# Patient Record
Sex: Female | Born: 1965 | Race: White | Hispanic: No | Marital: Married | Smoking: Former smoker
Health system: Southern US, Community
[De-identification: ages and names within clinical notes are randomized; demographics above are authoritative.]

## PROBLEM LIST (undated history)

## (undated) DIAGNOSIS — R519 Headache, unspecified: Secondary | ICD-10-CM

## (undated) DIAGNOSIS — D649 Anemia, unspecified: Secondary | ICD-10-CM

## (undated) DIAGNOSIS — N920 Excessive and frequent menstruation with regular cycle: Secondary | ICD-10-CM

## (undated) DIAGNOSIS — R51 Headache: Secondary | ICD-10-CM

## (undated) DIAGNOSIS — K921 Melena: Secondary | ICD-10-CM

## (undated) DIAGNOSIS — R197 Diarrhea, unspecified: Secondary | ICD-10-CM

## (undated) HISTORY — DX: Melena: K92.1

## (undated) HISTORY — DX: Headache, unspecified: R51.9

## (undated) HISTORY — DX: Headache: R51

## (undated) HISTORY — DX: Anemia, unspecified: D64.9

## (undated) HISTORY — DX: Diarrhea, unspecified: R19.7

## (undated) HISTORY — DX: Excessive and frequent menstruation with regular cycle: N92.0

## (undated) HISTORY — PX: TONSILLECTOMY: SUR1361

---
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
## 1998-11-25 ENCOUNTER — Other Ambulatory Visit: Admission: RE | Admit: 1998-11-25 | Discharge: 1998-11-25 | Payer: Self-pay | Admitting: Gynecology

## 2000-04-12 ENCOUNTER — Other Ambulatory Visit: Admission: RE | Admit: 2000-04-12 | Discharge: 2000-04-12 | Payer: Self-pay | Admitting: Gynecology

## 2000-09-14 ENCOUNTER — Other Ambulatory Visit: Admission: RE | Admit: 2000-09-14 | Discharge: 2000-09-14 | Payer: Self-pay | Admitting: Gynecology

## 2001-11-14 ENCOUNTER — Other Ambulatory Visit: Admission: RE | Admit: 2001-11-14 | Discharge: 2001-11-14 | Payer: Self-pay | Admitting: Gynecology

## 2003-01-24 ENCOUNTER — Other Ambulatory Visit: Admission: RE | Admit: 2003-01-24 | Discharge: 2003-01-24 | Payer: Self-pay | Admitting: Gynecology

## 2004-11-10 ENCOUNTER — Other Ambulatory Visit: Admission: RE | Admit: 2004-11-10 | Discharge: 2004-11-10 | Payer: Self-pay | Admitting: Gynecology

## 2007-05-03 ENCOUNTER — Other Ambulatory Visit: Admission: RE | Admit: 2007-05-03 | Discharge: 2007-05-03 | Payer: Self-pay | Admitting: Gynecology

## 2008-11-20 ENCOUNTER — Encounter: Payer: Self-pay | Admitting: Gynecology

## 2008-11-20 ENCOUNTER — Other Ambulatory Visit: Admission: RE | Admit: 2008-11-20 | Discharge: 2008-11-20 | Payer: Self-pay | Admitting: Gynecology

## 2008-11-20 ENCOUNTER — Ambulatory Visit: Payer: Self-pay | Admitting: Gynecology

## 2008-11-28 ENCOUNTER — Ambulatory Visit: Payer: Self-pay | Admitting: Gynecology

## 2008-11-29 ENCOUNTER — Ambulatory Visit: Payer: Self-pay | Admitting: Gynecology

## 2008-11-29 HISTORY — PX: ENDOMETRIAL ABLATION: SHX621

## 2008-12-23 ENCOUNTER — Ambulatory Visit: Payer: Self-pay | Admitting: Gynecology

## 2009-01-02 ENCOUNTER — Encounter: Admission: RE | Admit: 2009-01-02 | Discharge: 2009-01-02 | Payer: Self-pay | Admitting: Internal Medicine

## 2010-07-23 ENCOUNTER — Encounter: Admission: RE | Admit: 2010-07-23 | Discharge: 2010-07-23 | Payer: Self-pay | Admitting: Gynecology

## 2010-07-31 ENCOUNTER — Encounter: Admission: RE | Admit: 2010-07-31 | Discharge: 2010-07-31 | Payer: Self-pay | Admitting: Gynecology

## 2011-05-19 ENCOUNTER — Encounter: Payer: Self-pay | Admitting: Anesthesiology

## 2011-05-20 ENCOUNTER — Encounter: Payer: Self-pay | Admitting: Gynecology

## 2011-05-20 ENCOUNTER — Other Ambulatory Visit (HOSPITAL_COMMUNITY)
Admission: RE | Admit: 2011-05-20 | Discharge: 2011-05-20 | Disposition: A | Discharge status: Home / Self Care | Payer: 59 | Source: Ambulatory Visit | Attending: Gynecology | Admitting: Gynecology

## 2011-05-20 ENCOUNTER — Ambulatory Visit (INDEPENDENT_AMBULATORY_CARE_PROVIDER_SITE_OTHER): Payer: 59 | Admitting: Gynecology

## 2011-05-20 VITALS — BP 118/76 | Ht 64.5 in | Wt 149.0 lb

## 2011-05-20 DIAGNOSIS — Z01419 Encounter for gynecological examination (general) (routine) without abnormal findings: Secondary | ICD-10-CM

## 2011-05-20 DIAGNOSIS — Z13220 Encounter for screening for lipoid disorders: Secondary | ICD-10-CM

## 2011-05-20 DIAGNOSIS — R102 Pelvic and perineal pain: Secondary | ICD-10-CM

## 2011-05-20 DIAGNOSIS — R635 Abnormal weight gain: Secondary | ICD-10-CM

## 2011-05-20 DIAGNOSIS — N949 Unspecified condition associated with female genital organs and menstrual cycle: Secondary | ICD-10-CM

## 2011-05-20 DIAGNOSIS — O24419 Gestational diabetes mellitus in pregnancy, unspecified control: Secondary | ICD-10-CM | POA: Insufficient documentation

## 2011-05-20 DIAGNOSIS — O99810 Abnormal glucose complicating pregnancy: Secondary | ICD-10-CM

## 2011-05-20 NOTE — Progress Notes (Signed)
Barbara Watson 1966/04/25 [id number]   History:    45 y.o.  for annual exam and with complaint that the summer of 2011 she had some right hip right-sided discomfort when sitting in a car for long periods of time. And most recently she's had right lower quadrant discomfort on and off with pressure sensation more to the lower abdomen. She denies any dyspareunia or any postcoital bleeding. Her husband has had a vasectomy and she has had an endometrial ablation in the past as well. Her last mammogram was in November of 2011 and she does her monthly self breast examination and her menstrual cycles are otherwise regular. She has gained some weight since last year and does have history of gestational diabetes.  Past medical history,surgical history, family history and social history were all reviewed and documented in the EPIC chart.  ROS:  Was performed and pertinent positives and negatives are included in the history.  Exam: chaperone present BP 118/76  Ht 5' 4.5" (1.638 m)  Wt 149 lb (67.586 kg)  BMI 25.18 kg/m2  LMP 04/29/2011  Body mass index is 25.18 kg/(m^2).  General appearance : Well developed well nourished female. No acute distress HEENT: Neck supple, trachea midline, no carotid bruits, no thyroidmegaly Lungs: Clear to auscultation, no rhonchi or wheezes, or rib retractions  Heart: Regular rate and rhythm, no murmurs or gallops Breast:Examined in sitting and supine position were symmetrical in appearance, no palpable masses or tenderness,  no skin retraction, no nipple inversion, no nipple discharge, no skin discoloration, no axillary or supraclavicular lymphadenopathy Abdomen: no palpable masses or tenderness, no rebound or guarding Extremities: no edema or skin discoloration or tenderness  Pelvic:  Bartholin, Urethra, Skene Glands: Within normal limits             Vagina: No gross lesions or discharge  Cervix: No gross lesions or discharge  Uterus  irregular shaped especially to  the right fundal region cannot rule out fibroid versus right ovarian cyst, slightly tender.  Adnexa right adnexal fullness versus ovarian cyst versus a right fundal subserosal fibroid. Left adnexa unremarkable. Anus and perineum  normal   Rectovaginal  normal sphincter tone without palpated masses or tenderness             Hemoccult not done     Assessment/Plan:  45 y.o. female for annual exam with complaint of on and off right lower quadrant discomfort which has increased more frequently than compare from last year. Irregularities and pelvic exam cannot rule out subserosal fibroid versus a right ovarian cyst. Patient will have an ultrasound next week to better delineate. Because of gestational diabetes we'll check a random blood sugar will check her take TSH because or weight gain along with will check her CBC urinalysis cholesterol and Pap smear.   Ok Edwards MD, 12:14 PM 05/20/2011

## 2011-05-31 ENCOUNTER — Ambulatory Visit: Admission: RE | Admit: 2011-05-31 | Payer: 59 | Source: Ambulatory Visit

## 2011-05-31 ENCOUNTER — Ambulatory Visit (INDEPENDENT_AMBULATORY_CARE_PROVIDER_SITE_OTHER): Payer: 59 | Admitting: Gynecology

## 2011-05-31 DIAGNOSIS — K4190 Unilateral femoral hernia, without obstruction or gangrene, not specified as recurrent: Secondary | ICD-10-CM

## 2011-05-31 DIAGNOSIS — N949 Unspecified condition associated with female genital organs and menstrual cycle: Secondary | ICD-10-CM

## 2011-05-31 DIAGNOSIS — R102 Pelvic and perineal pain: Secondary | ICD-10-CM

## 2011-05-31 NOTE — Patient Instructions (Signed)
Barbara Watson, our office will call you with appointment with Dr. Florene Route surgeon to evaluate what I believe is a femoral hernia.  Patient information: Inguinal and femoral (groin) hernias (The Basics)   What is a hernia? - A hernia is an area in a muscle layer that is weak or torn. Often when there is a hernia, tissues that are normally held in by the muscle layer bulge or stick out through the weak or torn spot.  Hernias can happen in different parts of the body. When they happen where the thigh and body meet (called the groin), they are called inguinal or femoral hernias. Inguinal hernias are a bit higher on the groin than femoral hernias) . Both of these types of hernias can form a sac that holds a loop of intestine or a piece of fat pad that normally sits inside the belly. (The fat pad is called the omentum.) What are the symptoms of a groin hernia? - Groin hernias do not always cause symptoms. But when symptoms do occur, they can include: A heavy or tugging feeling in the groin area  Dull pain that is worst when straining, lifting, coughing, or otherwise using the muscles near the groin  A bulge or lump at the groin Hernias can be very painful and even dangerous if the tissue in the hernia becomes trapped and unable to slide back into the belly). When this happens, the tissue does not get enough blood, so it can become swollen or get damaged.  Should I see a doctor or nurse? - Yes. See a doctor or nurse if you:  Feel or see a bulge in your groin, or  Feel a pulling sensation or pain in your groin even if you have no bulge In most cases, doctors can diagnose a hernia just by doing an exam. During the exam, the doctor will ask you to cough while pressing on the bulge. This can be uncomfortable, but it is necessary to find the source of the problem.  Most of the time, the contents of the hernia can be "reduced," or gently pushed back into the belly. Still, there are times when the hernia  gets trapped and can't be pushed back in. If that happens, the tissue that is trapped can get damaged.  If you develop severe pain, or if the bulge can't be pushed back in, call your doctor or surgeon right away. This is an emergency situation.  How are hernias treated? - Not all hernias need treatment right away. But many do need to be repaired with surgery. Surgeons can repair groin hernias in 1 of 2 ways. The right surgery for you will depend on the size of your hernia, whether this is the first time it is getting repaired, and what your general health is like. The 2 types of surgery are: Open surgery - During an open surgery, the surgeon makes an incision near the hernia. Then he or she gently pushes the bulging tissue back into place. Next, the surgeon sews the weak muscle layer back together, so that nothing can bulge through. In some cases, surgeons will also patch the area with a piece of mesh. The mesh can make that part of the muscle wall stronger. That way the hernia is less likely to happen again.  Laparoscopic surgery - During laparoscopic surgery, the surgeon makes a few incisions that are much smaller than those used in open surgery. Then he or she inserts long thin tools into the area near the hernia.  One of the tools has a camera (laparoscope) on the end, which sends pictures to a TV screen. The surgeon can look at the picture on the screen to guide his or her movements. Then he or she uses the long tools to repair the weak muscle layer either with stitches alone or with mesh.  If your hernia has reduced the blood supply to a loop of intestine, your doctor might need to remove that piece of intestine and sew the two ends back together.  If you cannot have surgery because of other health problems, your doctor or surgeon might suggest that you wear a device called a "truss." This device works kind of like a belt or girdle and goes over the hernia to hold it in place. Used the wrong way, a truss  can actually do harm. Do not wear one unless your doctor or surgeon tells you to.

## 2011-05-31 NOTE — Progress Notes (Signed)
Barbara Watson, presented to the office today for followup ultrasound due to the fact at time of her annual exam last week in the office she had stated that since the summer of 2011 she has had right lower abdominal discomfort that radiated from her side she first experienced her she was sitting in a car for long periods of time. She states that her right lower quadrant discomfort is on and off but more pressure sensation especially when she crosses her legs. She denied any dyspareunia or any postcoital bleeding her husband has had a previous vasectomy.  Ultrasound today: Uterus measured 11 x 6.8 x 6.0 cm with an endometrial stripe of 7.8 mm (patient with prior endometrial ablation) patient was one small intramural myoma measured 11 x 9 mm and a second one 60 x 50 mm. The right ovary had evidence of a corpus luteum cyst since she is midcycle. Left ovary small cyst physiological measuring 20 x 14 mm.  Patient was examined in the supine and erect position. She had no CVA tenderness. Abdomen was soft nontender no rebound or guarding. During the examination she was tender in her inguinal region. On Valsalva the tenderness was more evident. When she was examined in the erect position a small defect in the femoral canal was evident with a slight bulge when she coughed or bear down realistic in the point of discomfort that patient has been experiencing.  Assessment: Suspect right femoral hernia reducible. We'll referred to my general surgery colleagues for further assessment and management.

## 2011-06-18 ENCOUNTER — Encounter: Payer: Self-pay | Admitting: *Deleted

## 2011-06-18 NOTE — Progress Notes (Signed)
  Recent office note faxed to [facility] [place] [facility] per request for pt appointment for hernia.

## 2011-06-21 ENCOUNTER — Other Ambulatory Visit (INDEPENDENT_AMBULATORY_CARE_PROVIDER_SITE_OTHER): Payer: Self-pay | Admitting: General Surgery

## 2011-06-21 ENCOUNTER — Ambulatory Visit (INDEPENDENT_AMBULATORY_CARE_PROVIDER_SITE_OTHER): Payer: 59 | Admitting: General Surgery

## 2011-06-21 ENCOUNTER — Encounter (INDEPENDENT_AMBULATORY_CARE_PROVIDER_SITE_OTHER): Payer: Self-pay | Admitting: General Surgery

## 2011-06-21 VITALS — BP 112/86 | HR 60 | Temp 97.6°F | Resp 16 | Ht 65.0 in | Wt 149.1 lb

## 2011-06-21 DIAGNOSIS — R1031 Right lower quadrant pain: Secondary | ICD-10-CM

## 2011-06-21 DIAGNOSIS — K4190 Unilateral femoral hernia, without obstruction or gangrene, not specified as recurrent: Secondary | ICD-10-CM

## 2011-06-21 NOTE — Patient Instructions (Signed)
Try to avoid repetitive lifting.

## 2011-06-22 NOTE — Progress Notes (Signed)
Chief Complaint  Patient presents with  . Other    new pt eval of unilateral femoral hernias    HPI Barbara Watson is a 45 y.o. female.   HPI She is referred by Dr. Lily Peer for evaluation of a possible right femoral hernia. Over the summer she been having pain in the right groin area. This was most noticeable when she was sitting in a car for long periods of time. It was made worse when she crossed her legs. The pain has become more persistent now. She states some times it radiates down her leg. Sometimes it feels like it's in her right hip. No obstructive symptoms. When she was examined by Dr. Lily Peer she is noted to have right groin tenderness and a possible bulge in the right femoral canal area in the standing position when she coughed. She is thus sent here for further evaluation of a possible right femoral hernia. She does have some constipation. No difficulty with urination. She is here with her husband.  Past Medical History  Diagnosis Date  . NSVD (normal spontaneous vaginal delivery)     X 2  . Menorrhagia   . Abdominal pain   . Blood in stool   . Diarrhea   . Constipation   . Generalized headaches   . Diabetes mellitus     gestational  . Anemia     Past Surgical History  Procedure Date  . Endometrial ablation 11/29/2008    HER OPTION  . Tonsillectomy and adenoidectomy 1981    Family History  Problem Relation Age of Onset  . Breast cancer Paternal Grandmother 26  . Heart disease Father     Social History History  Substance Use Topics  . Smoking status: Former Games developer  . Smokeless tobacco: Never Used  . Alcohol Use: Yes     OCCASIONALLY    No Known Allergies  Current Outpatient Prescriptions  Medication Sig Dispense Refill  . CALCIUM PO Take by mouth daily.        . Multiple Vitamin (MULTIVITAMIN PO) Take by mouth daily.          Review of Systems Review of Systems  Constitutional: Negative.   HENT: Negative.   Respiratory: Negative.     Cardiovascular: Negative.   Gastrointestinal: Negative.   Genitourinary: Negative for dysuria, difficulty urinating and dyspareunia.       Right groin pain.  Musculoskeletal:       Some right hip pain.  Hematological: Negative.     Blood pressure 112/86, pulse 60, temperature 97.6 F (36.4 C), resp. rate 16, height 5\' 5"  (1.651 m), weight 149 lb 2 oz (67.643 kg).  Physical Exam Physical Exam  Constitutional: She appears well-developed and well-nourished. No distress.  Abdominal: Soft. She exhibits no distension and no mass. There is no tenderness.  Genitourinary:       Right inguinal tenderness is present to deep palpation. When she's coughs there is a slight bulge felt in the right femoral canal area it quickly reduces. No bulges in the left inguinal area.  Musculoskeletal: Normal range of motion. She exhibits no edema.    Data Reviewed  Note from Dr. Lily Peer office.  Assessment    Right groin pain radiating down her right leg at times. Exam is suspicious for possible right femoral hernia. Other etiologies could be right labral tear or an occult back problem with referred pain.    Plan    We'll check a CT scan of the abdomen and pelvis to evaluate  for the right femoral hernia or other possible pathology. If a right femoral hernias present, I recommended a laparoscopic right femoral hernia repair with mesh. If the CT scan does not show an obvious right femoral hernia, I want her to come back for a second examination. If the clinical suspicion is still present, she may need laparoscopic exploration.  I have explained the procedure, risks, and aftercare of femoral hernia repair.  Risks include but are not limited to bleeding, infection, wound problems, anesthesia, recurrence, bladder or intestine injury, urinary retention, testicular dysfunction, chronic pain, mesh problems.  She seems to understand and agrees to proceed.       Ima Hafner J 06/22/2011, 7:06 AM

## 2011-06-25 ENCOUNTER — Ambulatory Visit
Admission: RE | Admit: 2011-06-25 | Discharge: 2011-06-25 | Disposition: A | Discharge status: Home / Self Care | Payer: 59 | Source: Ambulatory Visit | Attending: General Surgery | Admitting: General Surgery

## 2011-06-25 ENCOUNTER — Telehealth (INDEPENDENT_AMBULATORY_CARE_PROVIDER_SITE_OTHER): Payer: Self-pay | Admitting: General Surgery

## 2011-06-25 ENCOUNTER — Other Ambulatory Visit (INDEPENDENT_AMBULATORY_CARE_PROVIDER_SITE_OTHER): Payer: Self-pay | Admitting: General Surgery

## 2011-06-25 ENCOUNTER — Other Ambulatory Visit: Payer: 59

## 2011-06-25 DIAGNOSIS — R1031 Right lower quadrant pain: Secondary | ICD-10-CM

## 2011-06-25 DIAGNOSIS — K4190 Unilateral femoral hernia, without obstruction or gangrene, not specified as recurrent: Secondary | ICD-10-CM

## 2011-06-25 DIAGNOSIS — M25551 Pain in right hip: Secondary | ICD-10-CM

## 2011-06-25 MED ORDER — IOHEXOL 300 MG/ML  SOLN
100.0000 mL | Freq: Once | INTRAMUSCULAR | Status: AC | PRN
  Administered 2011-06-25: 100 mL via INTRAVENOUS

## 2011-06-25 NOTE — Telephone Encounter (Signed)
I discussed the results of the CT scan with her.  This shows no evidence of an inguinal or femoral hernia.  No obvious hip abnormality.  After further thought and discussion with Dr. Doneen Poisson (Orthopedic Surgeon), I am suspicious that this may be a labral or back problem.  Thus, I recommended a referral to Dr. Magnus Ivan instead of coming to see me again at this time.  She understands and agrees with the plan.

## 2011-07-06 ENCOUNTER — Ambulatory Visit (INDEPENDENT_AMBULATORY_CARE_PROVIDER_SITE_OTHER): Payer: 59 | Admitting: General Surgery

## 2011-07-06 ENCOUNTER — Other Ambulatory Visit: Payer: Self-pay | Admitting: Orthopaedic Surgery

## 2011-07-06 DIAGNOSIS — M25551 Pain in right hip: Secondary | ICD-10-CM

## 2011-07-11 ENCOUNTER — Other Ambulatory Visit: Payer: No Typology Code available for payment source

## 2011-07-15 ENCOUNTER — Ambulatory Visit
Admission: RE | Admit: 2011-07-15 | Discharge: 2011-07-15 | Disposition: A | Discharge status: Home / Self Care | Payer: No Typology Code available for payment source | Source: Ambulatory Visit | Attending: Orthopaedic Surgery | Admitting: Orthopaedic Surgery

## 2011-07-15 DIAGNOSIS — M25551 Pain in right hip: Secondary | ICD-10-CM

## 2011-08-12 ENCOUNTER — Other Ambulatory Visit: Payer: Self-pay | Admitting: Gynecology

## 2011-08-12 DIAGNOSIS — Z1231 Encounter for screening mammogram for malignant neoplasm of breast: Secondary | ICD-10-CM

## 2011-08-26 ENCOUNTER — Ambulatory Visit
Admission: RE | Admit: 2011-08-26 | Discharge: 2011-08-26 | Disposition: A | Discharge status: Home / Self Care | Payer: 59 | Source: Ambulatory Visit | Attending: Gynecology | Admitting: Gynecology

## 2011-08-26 DIAGNOSIS — Z1231 Encounter for screening mammogram for malignant neoplasm of breast: Secondary | ICD-10-CM

## 2012-09-20 ENCOUNTER — Other Ambulatory Visit: Payer: Self-pay | Admitting: Gynecology

## 2012-09-20 DIAGNOSIS — Z1231 Encounter for screening mammogram for malignant neoplasm of breast: Secondary | ICD-10-CM

## 2012-10-18 ENCOUNTER — Ambulatory Visit
Admission: RE | Admit: 2012-10-18 | Discharge: 2012-10-18 | Disposition: A | Discharge status: Home / Self Care | Payer: 59 | Source: Ambulatory Visit | Attending: Gynecology | Admitting: Gynecology

## 2012-10-18 DIAGNOSIS — Z1231 Encounter for screening mammogram for malignant neoplasm of breast: Secondary | ICD-10-CM

## 2013-05-10 ENCOUNTER — Encounter: Payer: Self-pay | Admitting: Gynecology

## 2013-05-10 ENCOUNTER — Ambulatory Visit (INDEPENDENT_AMBULATORY_CARE_PROVIDER_SITE_OTHER): Payer: 59 | Admitting: Gynecology

## 2013-05-10 VITALS — BP 126/74 | Ht 64.5 in | Wt 148.0 lb

## 2013-05-10 DIAGNOSIS — R232 Flushing: Secondary | ICD-10-CM

## 2013-05-10 DIAGNOSIS — N912 Amenorrhea, unspecified: Secondary | ICD-10-CM

## 2013-05-10 DIAGNOSIS — N951 Menopausal and female climacteric states: Secondary | ICD-10-CM

## 2013-05-10 DIAGNOSIS — Z01419 Encounter for gynecological examination (general) (routine) without abnormal findings: Secondary | ICD-10-CM

## 2013-05-10 DIAGNOSIS — N390 Urinary tract infection, site not specified: Secondary | ICD-10-CM | POA: Insufficient documentation

## 2013-05-10 LAB — CBC WITH DIFFERENTIAL/PLATELET
Basophils Absolute: 0 10*3/uL (ref 0.0–0.1)
Eosinophils Absolute: 0.1 10*3/uL (ref 0.0–0.7)
Eosinophils Relative: 1 % (ref 0–5)
Lymphocytes Relative: 28 % (ref 12–46)
MCH: 28.8 pg (ref 26.0–34.0)
MCV: 86.7 fL (ref 78.0–100.0)
Neutrophils Relative %: 61 % (ref 43–77)
Platelets: 316 10*3/uL (ref 150–400)
RDW: 12.5 % (ref 11.5–15.5)
WBC: 6.1 10*3/uL (ref 4.0–10.5)

## 2013-05-10 LAB — COMPREHENSIVE METABOLIC PANEL
Albumin: 4.3 g/dL (ref 3.5–5.2)
Alkaline Phosphatase: 53 U/L (ref 39–117)
BUN: 14 mg/dL (ref 6–23)
CO2: 26 mEq/L (ref 19–32)
Calcium: 9.1 mg/dL (ref 8.4–10.5)
Glucose, Bld: 90 mg/dL (ref 70–99)
Sodium: 138 mEq/L (ref 135–145)
Total Protein: 6.5 g/dL (ref 6.0–8.3)

## 2013-05-10 LAB — CHOLESTEROL, TOTAL: Cholesterol: 178 mg/dL (ref 0–200)

## 2013-05-10 MED ORDER — NITROFURANTOIN MONOHYD MACRO 100 MG PO CAPS: 100.0000 mg | ORAL_CAPSULE | Freq: Two times a day (BID) | ORAL | Status: DC

## 2013-05-10 NOTE — Progress Notes (Signed)
Barbara Watson 1965-12-22 [id number]   History:    47 y.o.  for annual gyn exam who states that she has not been seen in the office since 2012. Patient had been evaluated for a right groin pain to rule out inguinal hernia.but after CT scan they do not feel that she had a right inguinal hernia and she's been working on an exercise and the symptoms come and go and she's had for many years. She has not had a menstrual cycle in the past 5 months. She states that on and off she's had some vasomotor symptoms. Her mother which the menopause before the age of 38. She has had an endometrial ablation back in 2010. Her husband has had a vasectomy. Patient also brought to my attention that she has had recurrent urinary tract infections in November, January and June. She was asymptomatic today. Her last mammogram was assured which was normal.   Past medical history,surgical history, family history and social history were all reviewed and documented in the EPIC chart.  Gynecologic History Patient's last menstrual period was 04/19/2013. Contraception: vasectomy Last Pap: 2012. Results were: normal Last mammogram: 2014. Results were: normal  Obstetric History OB History  Gravida Para Term Preterm AB SAB TAB Ectopic Multiple Living  2 2        2     # Outcome Date GA Lbr Len/2nd Weight Sex Delivery Anes PTL Lv  2 PAR           1 PAR                ROS: A ROS was performed and pertinent positives and negatives are included in the history.  GENERAL: No fevers or chills. HEENT: No change in vision, no earache, sore throat or sinus congestion. NECK: No pain or stiffness. CARDIOVASCULAR: No chest pain or pressure. No palpitations. PULMONARY: No shortness of breath, cough or wheeze. GASTROINTESTINAL: No abdominal pain, nausea, vomiting or diarrhea, melena or bright red blood per rectum. GENITOURINARY: No urinary frequency, urgency, hesitancy or dysuria. MUSCULOSKELETAL: No joint or muscle pain, no back pain, no  recent trauma. DERMATOLOGIC: No rash, no itching, no lesions. ENDOCRINE: No polyuria, polydipsia, no heat or cold intolerance. No recent change in weight. HEMATOLOGICAL: No anemia or easy bruising or bleeding. NEUROLOGIC: No headache, seizures, numbness, tingling or weakness. PSYCHIATRIC: No depression, no loss of interest in normal activity or change in sleep pattern.     Exam: chaperone present  BP 126/74  Ht 5' 4.5" (1.638 m)  Wt 148 lb (67.132 kg)  BMI 25.02 kg/m2  LMP 04/19/2013  Body mass index is 25.02 kg/(m^2).  General appearance : Well developed well nourished female. No acute distress HEENT: Neck supple, trachea midline, no carotid bruits, no thyroidmegaly Lungs: Clear to auscultation, no rhonchi or wheezes, or rib retractions  Heart: Regular rate and rhythm, no murmurs or gallops Breast:Examined in sitting and supine position were symmetrical in appearance, no palpable masses or tenderness,  no skin retraction, no nipple inversion, no nipple discharge, no skin discoloration, no axillary or supraclavicular lymphadenopathy Abdomen: no palpable masses or tenderness, no rebound or guarding Extremities: no edema or skin discoloration or tenderness  Pelvic:  Bartholin, Urethra, Skene Glands: Within normal limits             Vagina: No gross lesions or discharge  Cervix: No gross lesions or discharge  Uterus  anteverted, normal size, shape and consistency, non-tender and mobile  Adnexa  Without masses or tenderness  Anus and  perineum  normal   Rectovaginal  normal sphincter tone without palpated masses or tenderness             Hemoccult not indicated     Assessment/Plan:  47 y.o. female for annual exam with five-month history of amenorrhea. She did have an endometrial ablation several years ago. Patient was some minor vasomotor symptoms. We will go ahead and check an [facility] today along with her TSH and prolactin. She denied any nipple discharge, headaches or visual disturbances.  We'll also check her CBC, screening cholesterol, comprehensive metabolic panel, TSH and urinalysis. She did receive the Tdap vaccine today. I'm going to prescribe her Macrobid to take one by mouth after intercourse prophylactically in the event of finding cystitis. Pap smear not done today the new guidelines were discussed. Patient was reminded to do her monthly breast exam. We discussed importance of calcium vitamin D and regular exercise for osteoporosis prevention.    Ok Edwards MD, 12:23 PM 05/10/2013

## 2013-05-10 NOTE — Patient Instructions (Addendum)
Tetanus, Diphtheria, Pertussis (Tdap) Vaccine What You Need to Know WHY GET VACCINATED? Tetanus, diphtheria and pertussis can be very serious diseases, even for adolescents and adults. Tdap vaccine can protect us from these diseases. TETANUS (Lockjaw) causes painful muscle tightening and stiffness, usually all over the body.  It can lead to tightening of muscles in the head and neck so you can't open your mouth, swallow, or sometimes even breathe. Tetanus kills about 1 out of 5 people who are infected. DIPHTHERIA can cause a thick coating to form in the back of the throat.  It can lead to breathing problems, paralysis, heart failure, and death. PERTUSSIS (Whooping Cough) causes severe coughing spells, which can cause difficulty breathing, vomiting and disturbed sleep.  It can also lead to weight loss, incontinence, and rib fractures. Up to 2 in 100 adolescents and 5 in 100 adults with pertussis are hospitalized or have complications, which could include pneumonia and death. These diseases are caused by bacteria. Diphtheria and pertussis are spread from person to person through coughing or sneezing. Tetanus enters the body through cuts, scratches, or wounds. Before vaccines, the [place] saw as many as 200,000 cases a year of diphtheria and pertussis, and hundreds of cases of tetanus. Since vaccination began, tetanus and diphtheria have dropped by about 99% and pertussis by about 80%. TDAP VACCINE Tdap vaccine can protect adolescents and adults from tetanus, diphtheria, and pertussis. One dose of Tdap is routinely given at age 11 or 12. People who did not get Tdap at that age should get it as soon as possible. Tdap is especially important for health care professionals and anyone having close contact with a baby younger than 12 months. Pregnant women should get a dose of Tdap during every pregnancy, to protect the newborn from pertussis. Infants are most at risk for severe, life-threatening  complications from pertussis. A similar vaccine, called Td, protects from tetanus and diphtheria, but not pertussis. A Td booster should be given every 10 years. Tdap may be given as one of these boosters if you have not already gotten a dose. Tdap may also be given after a severe cut or burn to prevent tetanus infection. Your doctor can give you more information. Tdap may safely be given at the same time as other vaccines. SOME PEOPLE SHOULD NOT GET THIS VACCINE  If you ever had a life-threatening allergic reaction after a dose of any tetanus, diphtheria, or pertussis containing vaccine, OR if you have a severe allergy to any part of this vaccine, you should not get Tdap. Tell your doctor if you have any severe allergies.  If you had a coma, or long or multiple seizures within 7 days after a childhood dose of DTP or DTaP, you should not get Tdap, unless a cause other than the vaccine was found. You can still get Td.  Talk to your doctor if you:  have epilepsy or another nervous system problem,  had severe pain or swelling after any vaccine containing diphtheria, tetanus or pertussis,  ever had Guillain-Barr Syndrome (GBS),  aren't feeling well on the day the shot is scheduled. RISKS OF A VACCINE REACTION With any medicine, including vaccines, there is a chance of side effects. These are usually mild and go away on their own, but serious reactions are also possible. Brief fainting spells can follow a vaccination, leading to injuries from falling. Sitting or lying down for about 15 minutes can help prevent these. Tell your doctor if you feel dizzy or light-headed, or   have vision changes or ringing in the ears. Mild problems following Tdap (Did not interfere with activities)  Pain where the shot was given (about 3 in 4 adolescents or 2 in 3 adults)  Redness or swelling where the shot was given (about 1 person in 5)  Mild fever of at least 100.4F (up to about 1 in 25 adolescents or 1 in  100 adults)  Headache (about 3 or 4 people in 10)  Tiredness (about 1 person in 3 or 4)  Nausea, vomiting, diarrhea, stomach ache (up to 1 in 4 adolescents or 1 in 10 adults)  Chills, body aches, sore joints, rash, swollen glands (uncommon) Moderate problems following Tdap (Interfered with activities, but did not require medical attention)  Pain where the shot was given (about 1 in 5 adolescents or 1 in 100 adults)  Redness or swelling where the shot was given (up to about 1 in 16 adolescents or 1 in 25 adults)  Fever over 102F (about 1 in 100 adolescents or 1 in 250 adults)  Headache (about 3 in 20 adolescents or 1 in 10 adults)  Nausea, vomiting, diarrhea, stomach ache (up to 1 or 3 people in 100)  Swelling of the entire arm where the shot was given (up to about 3 in 100). Severe problems following Tdap (Unable to perform usual activities, required medical attention)  Swelling, severe pain, bleeding and redness in the arm where the shot was given (rare). A severe allergic reaction could occur after any vaccine (estimated less than 1 in a million doses). WHAT IF THERE IS A SERIOUS REACTION? What should I look for?  Look for anything that concerns you, such as signs of a severe allergic reaction, very high fever, or behavior changes. Signs of a severe allergic reaction can include hives, swelling of the face and throat, difficulty breathing, a fast heartbeat, dizziness, and weakness. These would start a few minutes to a few hours after the vaccination. What should I do?  If you think it is a severe allergic reaction or other emergency that can't wait, call 9-1-1 or get the person to the nearest hospital. Otherwise, call your doctor.  Afterward, the reaction should be reported to the "Vaccine Adverse Event Reporting System" (VAERS). Your doctor might file this report, or you can do it yourself through the VAERS web site at www.vaers.hhs.gov, or by calling 1-800-822-7967. VAERS is  only for reporting reactions. They do not give medical advice.  THE NATIONAL VACCINE INJURY COMPENSATION PROGRAM The National Vaccine Injury Compensation Program (VICP) is a federal program that was created to compensate people who may have been injured by certain vaccines. Persons who believe they may have been injured by a vaccine can learn about the program and about filing a claim by calling 1-800-338-2382 or visiting the VICP website at www.hrsa.gov/vaccinecompensation. HOW CAN I LEARN MORE?  Ask your doctor.  Call your local or state health department.  Contact the Centers for Disease Control and Prevention (CDC):  Call 1-800-232-4636 or visit CDC's website at www.cdc.gov/vaccines. CDC Tdap Vaccine VIS (01/20/12) Document Released: 02/29/2012 Document Revised: 05/24/2012 Document Reviewed: 02/29/2012 Exit[employer] Patient Information 2014 [employer]. Perimenopause Perimenopause is the time when your body begins to move into the menopause (no menstrual period for 12 straight months). It is a natural process. Perimenopause can begin 2 to 8 years before the menopause and usually lasts for one year after the menopause. During this time, your ovaries may or may not produce an egg. The ovaries vary in   their production of estrogen and progesterone hormones each month. This can cause irregular menstrual periods, difficulty in getting pregnant, vaginal bleeding between periods and uncomfortable symptoms. CAUSES  Irregular production of the ovarian hormones, estrogen and progesterone, and not ovulating every month.  Other causes include:  Tumor of the pituitary gland in the brain.  Medical disease that affects the ovaries.  Radiation treatment.  Chemotherapy.  Unknown causes.  Heavy smoking and excessive alcohol intake can bring on perimenopause sooner. SYMPTOMS   Hot flashes.  Night sweats.  Irregular menstrual periods.  Decrease sex drive.  Vaginal  dryness.  Headaches.  Mood swings.  Depression.  Memory problems.  Irritability.  Tiredness.  Weight gain.  Trouble getting pregnant.  The beginning of losing bone cells (osteoporosis).  The beginning of hardening of the arteries (atherosclerosis). DIAGNOSIS  Your caregiver will make a diagnosis by analyzing your age, menstrual history and your symptoms. They will do a physical exam noting any changes in your body, especially your female organs. Female hormone tests may or may not be helpful depending on the amount and when you produce the female hormones. However, other hormone tests may be helpful (ex. thyroid hormone) to rule out other problems. TREATMENT  The decision to treat during the perimenopause should be made by you and your caregiver depending on how the symptoms are affecting you and your life style. There are various treatments available such as:  Treating individual symptoms with a specific medication for that symptom (ex. tranquilizer for depression).  Herbal medications that can help specific symptoms.  Counseling.  Group therapy.  No treatment. HOME CARE INSTRUCTIONS   Before seeing your caregiver, make a list of your menstrual periods (when the occur, how heavy they are, how long between periods and how long they last), your symptoms and when they started.  Take the medication as recommended by your caregiver.  Sleep and rest.  Exercise.  Eat a diet that contains calcium (good for your bones) and soy (acts like estrogen hormone).  Do not smoke.  Avoid alcoholic beverages.  Taking vitamin E may help in certain cases.  Take calcium and vitamin D supplements to help prevent bone loss.  Group therapy is sometimes helpful.  Acupuncture may help in some cases. SEEK MEDICAL CARE IF:   You have any of the above and want to know if it is perimenopause.  You want advice and treatment for any of your symptoms mentioned above.  You need a  referral to a specialist (gynecologist, psychiatrist or psychologist). SEEK IMMEDIATE MEDICAL CARE IF:   You have vaginal bleeding.  Your period lasts longer than 8 days.  You periods are recurring sooner than 21 days.  You have bleeding after intercourse.  You have severe depression.  You have pain when you urinate.  You have severe headaches.  You develop vision problems. Document Released: 10/07/2004 Document Revised: 11/22/2011 Document Reviewed: 06/27/2008 [employer] Patient Information 2014 [employer].  

## 2013-05-11 LAB — URINALYSIS W MICROSCOPIC + REFLEX CULTURE
Bacteria, UA: NONE SEEN
Hgb urine dipstick: NEGATIVE
Ketones, ur: NEGATIVE mg/dL
Leukocytes, UA: NEGATIVE
Nitrite: NEGATIVE
Protein, ur: NEGATIVE mg/dL
Urobilinogen, UA: 0.2 mg/dL (ref 0.0–1.0)

## 2013-05-11 LAB — FOLLICLE STIMULATING HORMONE: FSH: 9.2 m[IU]/mL

## 2013-05-11 LAB — PROLACTIN: Prolactin: 9.2 ng/mL

## 2013-05-11 LAB — TSH: TSH: 1.361 u[IU]/mL (ref 0.350–4.500)

## 2013-05-16 ENCOUNTER — Encounter: Payer: Self-pay | Admitting: Gynecology

## 2013-05-23 ENCOUNTER — Encounter: Payer: Self-pay | Admitting: Gynecology

## 2013-09-04 ENCOUNTER — Encounter (HOSPITAL_BASED_OUTPATIENT_CLINIC_OR_DEPARTMENT_OTHER): Payer: Self-pay | Admitting: Emergency Medicine

## 2013-09-04 ENCOUNTER — Emergency Department (HOSPITAL_BASED_OUTPATIENT_CLINIC_OR_DEPARTMENT_OTHER)
Admission: EM | Admit: 2013-09-04 | Discharge: 2013-09-04 | Disposition: A | Discharge status: Home / Self Care | Payer: 59 | Attending: Emergency Medicine | Admitting: Emergency Medicine

## 2013-09-04 DIAGNOSIS — Z8632 Personal history of gestational diabetes: Secondary | ICD-10-CM | POA: Insufficient documentation

## 2013-09-04 DIAGNOSIS — W260XXA Contact with knife, initial encounter: Secondary | ICD-10-CM | POA: Insufficient documentation

## 2013-09-04 DIAGNOSIS — Y929 Unspecified place or not applicable: Secondary | ICD-10-CM | POA: Insufficient documentation

## 2013-09-04 DIAGNOSIS — Z8719 Personal history of other diseases of the digestive system: Secondary | ICD-10-CM | POA: Insufficient documentation

## 2013-09-04 DIAGNOSIS — Y939 Activity, unspecified: Secondary | ICD-10-CM | POA: Insufficient documentation

## 2013-09-04 DIAGNOSIS — S61219A Laceration without foreign body of unspecified finger without damage to nail, initial encounter: Secondary | ICD-10-CM

## 2013-09-04 DIAGNOSIS — Z8742 Personal history of other diseases of the female genital tract: Secondary | ICD-10-CM | POA: Insufficient documentation

## 2013-09-04 DIAGNOSIS — Z79899 Other long term (current) drug therapy: Secondary | ICD-10-CM | POA: Insufficient documentation

## 2013-09-04 DIAGNOSIS — Z792 Long term (current) use of antibiotics: Secondary | ICD-10-CM | POA: Insufficient documentation

## 2013-09-04 DIAGNOSIS — S61209A Unspecified open wound of unspecified finger without damage to nail, initial encounter: Secondary | ICD-10-CM | POA: Insufficient documentation

## 2013-09-04 DIAGNOSIS — Z862 Personal history of diseases of the blood and blood-forming organs and certain disorders involving the immune mechanism: Secondary | ICD-10-CM | POA: Insufficient documentation

## 2013-09-04 DIAGNOSIS — Z87891 Personal history of nicotine dependence: Secondary | ICD-10-CM | POA: Insufficient documentation

## 2013-09-04 NOTE — ED Provider Notes (Signed)
CSN: [id number]     Arrival date & time 09/04/13  1241 History   First MD Initiated Contact with Patient 09/04/13 1321     Chief Complaint  Patient presents with  . Laceration   (Consider location/radiation/quality/duration/timing/severity/associated sxs/prior Treatment) Patient is a 47 y.o. female presenting with skin laceration. The history is provided by the patient. No language interpreter was used.  Laceration Length (cm):  1.5 Depth:  Through dermis Quality: straight   Time since incident:  1 hour Laceration mechanism:  Knife Pain details:    Quality:  Aching   Severity:  No pain   Timing:  Constant   Progression:  Worsening Foreign body present:  No foreign bodies Worsened by:  Nothing tried Ineffective treatments:  None tried Tetanus status:  Up to date   Past Medical History  Diagnosis Date  . NSVD (normal spontaneous vaginal delivery)     X 2  . Menorrhagia   . Abdominal pain   . Blood in stool   . Diarrhea   . Constipation   . Generalized headaches   . Diabetes mellitus     gestational  . Anemia    Past Surgical History  Procedure Laterality Date  . Endometrial ablation  11/29/2008    [facility]  . Tonsillectomy and adenoidectomy  1981   Family History  Problem Relation Age of Onset  . Breast cancer Paternal Grandmother 65  . Heart disease Father    History  Substance Use Topics  . Smoking status: Former Games developer  . Smokeless tobacco: Never Used  . Alcohol Use: Yes     Comment: OCCASIONALLY   OB History   Grav Para Term Preterm Abortions TAB SAB Ect Mult Living   2 2        2      Review of Systems  Skin: Positive for wound.  All other systems reviewed and are negative.    Allergies  Review of patient's allergies indicates no known allergies.  Home Medications   Current Outpatient Rx  Name  Route  Sig  Dispense  Refill  . CALCIUM PO   Oral   Take by mouth daily.           . Cranberry Extract 250 MG TABS   Oral   Take by  mouth.         . Multiple Vitamin (MULTIVITAMIN PO)   Oral   Take by mouth daily.           . nitrofurantoin, macrocrystal-monohydrate, (MACROBID) 100 MG capsule   Oral   Take 1 capsule (100 mg total) by mouth 2 (two) times daily.   14 capsule   5    BP 128/83  Temp(Src) 98.3 F (36.8 C) (Oral)  Resp 16  Ht 5\' 4"  (1.626 m)  Wt 145 lb (65.772 kg)  BMI 24.88 kg/m2  SpO2 100%  LMP 08/21/2013 Physical Exam  Constitutional: She is oriented to person, place, and time. She appears well-developed. She appears distressed.  Musculoskeletal: She exhibits tenderness.  1.5 cm laceration left index finger from  Ns and nv intact  Neurological: She is alert and oriented to person, place, and time. She has normal reflexes.  Psychiatric: She has a normal mood and affect.    ED Course  LACERATION REPAIR Date/Time: 09/04/2013 2:11 PM Performed by: Elson Areas Authorized by: Elson Areas Consent: Verbal consent obtained. Risks and benefits: risks, benefits and alternatives were discussed Consent given by: patient Patient understanding: patient states understanding  of the procedure being performed Required items: required blood products, implants, devices, and special equipment available Body area: upper extremity Location details: left index finger Laceration length: 1.5 cm Tendon involvement: none Nerve involvement: none Anesthesia: local infiltration Preparation: Patient was prepped and draped in the usual sterile fashion. Irrigation solution: saline Debridement: none Degree of undermining: none Skin closure: 5-0 Prolene Number of sutures: 3 Technique: simple Approximation: close Approximation difficulty: simple Patient tolerance: Patient tolerated the procedure well with no immediate complications.   (including critical care time) Labs Review Labs Reviewed - No data to display Imaging Review No results found.  EKG Interpretation   None       MDM   1.  Laceration of finger, initial encounter    Pt advised suture removal in 8 days    Elson Areas, PA-C 09/04/13 1412

## 2013-09-04 NOTE — ED Notes (Signed)
Pt has laceration to index finger on left hand.  CMS intact. No bleeding at present. Pt sts she accidentally cut it on butcher knife.

## 2013-09-04 NOTE — ED Provider Notes (Signed)
Medical screening examination/treatment/procedure(s) were performed by non-physician practitioner and as supervising physician I was immediately available for consultation/collaboration.  EKG Interpretation   None         Holston Oyama, MD 09/04/13 1419 

## 2013-09-04 NOTE — ED Notes (Signed)
Suture cart at bedside 

## 2014-05-27 ENCOUNTER — Other Ambulatory Visit: Payer: Self-pay

## 2014-05-27 DIAGNOSIS — Z1231 Encounter for screening mammogram for malignant neoplasm of breast: Secondary | ICD-10-CM

## 2014-06-17 ENCOUNTER — Ambulatory Visit
Admission: RE | Admit: 2014-06-17 | Discharge: 2014-06-17 | Disposition: A | Discharge status: Home / Self Care | Payer: 59 | Source: Ambulatory Visit

## 2014-06-17 DIAGNOSIS — Z1231 Encounter for screening mammogram for malignant neoplasm of breast: Secondary | ICD-10-CM

## 2014-07-08 ENCOUNTER — Encounter: Payer: 59 | Admitting: Gynecology

## 2014-07-15 ENCOUNTER — Encounter (HOSPITAL_BASED_OUTPATIENT_CLINIC_OR_DEPARTMENT_OTHER): Payer: Self-pay | Admitting: Emergency Medicine

## 2014-07-25 ENCOUNTER — Encounter: Payer: 59 | Admitting: Gynecology

## 2015-05-15 ENCOUNTER — Encounter: Payer: Self-pay | Admitting: Gynecology

## 2015-05-15 ENCOUNTER — Ambulatory Visit (INDEPENDENT_AMBULATORY_CARE_PROVIDER_SITE_OTHER): Payer: BLUE CROSS/BLUE SHIELD | Admitting: Gynecology

## 2015-05-15 ENCOUNTER — Other Ambulatory Visit (HOSPITAL_COMMUNITY)
Admission: RE | Admit: 2015-05-15 | Discharge: 2015-05-15 | Disposition: A | Discharge status: Home / Self Care | Payer: BLUE CROSS/BLUE SHIELD | Source: Ambulatory Visit | Attending: Gynecology | Admitting: Gynecology

## 2015-05-15 VITALS — Ht 64.5 in | Wt 148.0 lb

## 2015-05-15 DIAGNOSIS — N951 Menopausal and female climacteric states: Secondary | ICD-10-CM

## 2015-05-15 DIAGNOSIS — K648 Other hemorrhoids: Secondary | ICD-10-CM | POA: Diagnosis not present

## 2015-05-15 DIAGNOSIS — Z1151 Encounter for screening for human papillomavirus (HPV): Secondary | ICD-10-CM | POA: Insufficient documentation

## 2015-05-15 DIAGNOSIS — Z01419 Encounter for gynecological examination (general) (routine) without abnormal findings: Secondary | ICD-10-CM

## 2015-05-15 DIAGNOSIS — K5901 Slow transit constipation: Secondary | ICD-10-CM | POA: Diagnosis not present

## 2015-05-15 MED ORDER — HYDROCORTISONE ACETATE 25 MG RE SUPP: 25.0000 mg | Freq: Two times a day (BID) | RECTAL | Status: AC

## 2015-05-15 MED ORDER — LINACLOTIDE 145 MCG PO CAPS: 145.0000 ug | ORAL_CAPSULE | Freq: Every day | ORAL | Status: DC

## 2015-05-15 NOTE — Patient Instructions (Signed)
Linaclotide oral capsules What is this medicine? LINACLOTIDE (lin a KLOE tide) is used to treat irritable bowel syndrome (IBS) with constipation as the main problem. It may also be used for relief of chronic constipation. This medicine may be used for other purposes; ask your health care provider or pharmacist if you have questions. COMMON BRAND NAME(S): Linzess What should I tell my health care provider before I take this medicine? They need to know if you have any of these conditions: -history of stool (fecal) impaction -now have diarrhea or have diarrhea often -other medical condition -stomach or intestinal disease, including bowel obstruction or abdominal adhesions -an unusual or allergic reaction to linaclotide, other medicines, foods, dyes, or preservatives -pregnant or trying to get pregnant -breast-feeding How should I use this medicine? Take this medicine by mouth with a glass of water. Follow the directions on the prescription label. Do not cut, crush or chew this medicine. Take on an empty stomach, at least 30 minutes before your first meal of the day. Take your medicine at regular intervals. Do not take your medicine more often than directed. Do not stop taking except on your doctor's advice. A special MedGuide will be given to you by the pharmacist with each prescription and refill. Be sure to read this information carefully each time. Talk to your pediatrician regarding the use of this medicine in children. This medicine is not approved for use in children. Overdosage: If you think you've taken too much of this medicine contact a poison control center or emergency room at once. Overdosage: If you think you have taken too much of this medicine contact a poison control center or emergency room at once. NOTE: This medicine is only for you. Do not share this medicine with others. What if I miss a dose? If you miss a dose, just skip that dose. Wait until your next dose, and take only  that dose. Do not take double or extra doses. What may interact with this medicine? -certain medicines for bowel problems or bladder incontinence (these can cause constipation) This list may not describe all possible interactions. Give your health care provider a list of all the medicines, herbs, non-prescription drugs, or dietary supplements you use. Also tell them if you smoke, drink alcohol, or use illegal drugs. Some items may interact with your medicine. What should I watch for while using this medicine? Visit your doctor for regular check ups. Tell your doctor if your symptoms do not get better or if they get worse. Diarrhea is a common side effect of this medicine. It often begins within 2 weeks of starting this medicine. Stop taking this medicine and call your doctor if you get severe diarrhea. Stop taking this medicine and call your doctor or go to the nearest hospital emergency room right away if you develop unusual or severe stomach-area (abdominal) pain, especially if you also have bright red, bloody stools or black stools that look like tar. What side effects may I notice from receiving this medicine? Side effects that you should report to your doctor or health care professional as soon as possible: -allergic reactions like skin rash, itching or hives, swelling of the face, lips, or tongue -black, tarry stools -bloody or watery diarrhea -new or worsening stomach pain -severe or prolonged diarrhea Side effects that usually do not require medical attention (Report these to your doctor or health care professional if they continue or are bothersome.): -bloating -gas -loose stools This list may not describe all possible side effects.  Call your doctor for medical advice about side effects. You may report side effects to FDA at 1-800-FDA-1088. Where should I keep my medicine? Keep out of the reach of children. Store at room temperature between 20 and 25 degrees C (68 and 77 degrees F).  Keep this medicine in the original container. Keep tightly closed in a dry place. Do not remove the desiccant packet from the bottle, it helps to protect your medicine from moisture. Throw away any unused medicine after the expiration date. NOTE: This sheet is a summary. It may not cover all possible information. If you have questions about this medicine, talk to your doctor, pharmacist, or health care provider.  2015, [employer]vier/[employer] Standard. (2011-05-18 13:05:27) About Constipation  Constipation Overview Constipation is the most common gastrointestinal complaint - about 4 million Americans experience constipation and make 2.5 million physician visits a year to get help for the problem.  Constipation can occur when the colon absorbs too much water, the colon's muscle contraction is slow or sluggish, and/or there is delayed transit time through the colon.  The result is stool that is hard and dry.  Indicators of constipation include straining during bowel movements greater than 25% of the time, having fewer than three bowel movements per week, and/or the feeling of incomplete evacuation.  There are established guidelines ([place] II ) for defining constipation. A person needs to have two or more of the following symptoms for at least 12 weeks (not necessarily consecutive) in the preceding 12 months: . Straining in  greater than 25% of bowel movements . Lumpy or hard stools in greater than 25% of bowel movements . Sensation of incomplete emptying in greater than 25% of bowel movements . Sensation of anorectal obstruction/blockade in greater than 25% of bowel movements . Manual maneuvers to help empty greater than 25% of bowel movements (e.g., digital evacuation, support of the pelvic floor)  . Less than  3 bowel movements/week . Loose stools are not present, and criteria for irritable bowel syndrome are insufficient  Common Causes of Constipation . Lack of fiber in your diet . Lack of physical  activity . Medications, including iron and calcium supplements  . Dairy intake . Dehydration . Abuse of laxatives  Travel  Irritable Bowel Syndrome  Pregnancy  Luteal phase of menstruation (after ovulation and before menses)  Colorectal problems  Intestinal Dysfunction  Treating Constipation  There are several ways of treating constipation, including changes to diet and exercise, use of laxatives, adjustments to the pelvic floor, and scheduled toileting.  These treatments include: . increasing fiber and fluids in the diet  . increasing physical activity . learning muscle coordination   learning proper toileting techniques and toileting modifications   designing and sticking  to a toileting schedule     2007, [employer] Doc.22Hormone Therapy At menopause, your body begins making less estrogen and progesterone hormones. This causes the body to stop having menstrual periods. This is because estrogen and progesterone hormones control your periods and menstrual cycle. A lack of estrogen may cause symptoms such as:  Hot flushes (or hot flashes).  Vaginal dryness.  Dry skin.  Loss of sex drive.  Risk of bone loss (osteoporosis). When this happens, you may choose to take hormone therapy to get back the estrogen lost during menopause. When the hormone estrogen is given alone, it is usually referred to as ET (Estrogen Therapy). When the hormone progestin is combined with estrogen, it is generally called HT (Hormone Therapy). This was formerly known  as hormone replacement therapy (HRT). Your caregiver can help you make a decision on what will be best for you. The decision to use HT seems to change often as new studies are done. Many studies do not agree on the benefits of hormone replacement therapy. LIKELY BENEFITS OF HT INCLUDE PROTECTION FROM:  Hot Flushes (also called hot flashes) - A hot flush is a sudden feeling of heat that spreads over the face and body. The  skin may redden like a blush. It is connected with sweats and sleep disturbance. Women going through menopause may have hot flushes a few times a month or several times per day depending on the woman.  Osteoporosis (bone loss)- Estrogen helps guard against bone loss. After menopause, a woman's bones slowly lose calcium and become weak and brittle. As a result, bones are more likely to break. The hip, wrist, and spine are affected most often. Hormone therapy can help slow bone loss after menopause. Weight bearing exercise and taking calcium with vitamin D also can help prevent bone loss. There are also medications that your caregiver can prescribe that can help prevent osteoporosis.  Vaginal Dryness - Loss of estrogen causes changes in the vagina. Its lining may become thin and dry. These changes can cause pain and bleeding during sexual intercourse. Dryness can also lead to infections. This can cause burning and itching. (Vaginal estrogen treatment can help relieve pain, itching, and dryness.)  Urinary Tract Infections are more common after menopause because of lack of estrogen. Some women also develop urinary incontinence because of low estrogen levels in the vagina and bladder.  Possible other benefits of estrogen include a positive effect on mood and short-term memory in women. RISKS AND COMPLICATIONS  Using estrogen alone without progesterone causes the lining of the uterus to grow. This increases the risk of lining of the uterus (endometrial) cancer. Your caregiver should give another hormone called progestin if you have a uterus.  Women who take combined (estrogen and progestin) HT appear to have an increased risk of breast cancer. The risk appears to be small, but increases throughout the time that HT is taken.  Combined therapy also makes the breast tissue slightly denser which makes it harder to read mammograms (breast X-rays).  Combined, estrogen and progesterone therapy can be taken  together every day, in which case there may be spotting of blood. HT therapy can be taken cyclically in which case you will have menstrual periods. Cyclically means HT is taken for a set amount of days, then not taken, then this process is repeated.  HT may increase the risk of stroke, heart attack, breast cancer and forming blood clots in your leg.  Transdermal estrogen (estrogen that is absorbed through the skin with a patch or a cream) may have more positive results with:  Cholesterol.  Blood pressure.  Blood clots. Having the following conditions may indicate you should not have HT:  Endometrial cancer.  Liver disease.  Breast cancer.  Heart disease.  History of blood clots.  Stroke. TREATMENT   If you choose to take HT and have a uterus, usually estrogen and progestin are prescribed.  Your caregiver will help you decide the best way to take the medications.  Possible ways to take estrogen include:  Pills.  Patches.  Gels.  Sprays.  Vaginal estrogen cream, rings and tablets.  It is best to take the lowest dose possible that will help your symptoms and take them for the shortest period of time that you  can.  Hormone therapy can help relieve some of the problems (symptoms) that affect women at menopause. Before making a decision about HT, talk to your caregiver about what is best for you. Be well informed and comfortable with your decisions. HOME CARE INSTRUCTIONS   Follow your caregivers advice when taking the medications.  A Pap test is done to screen for cervical cancer.  The first Pap test should be done at age 7.  Between ages 80 and 28, Pap tests are repeated every 2 years.  Beginning at age 49, you are advised to have a Pap test every 3 years as long as your past 3 Pap tests have been normal.  Some women have medical problems that increase the chance of getting cervical cancer. Talk to your caregiver about these problems. It is especially important  to talk to your caregiver if a new problem develops soon after your last Pap test. In these cases, your caregiver may recommend more frequent screening and Pap tests.  The above recommendations are the same for women who have or have not gotten the vaccine for HPV (Human Papillomavirus).  If you had a hysterectomy for a problem that was not a cancer or a condition that could lead to cancer, then you no longer need Pap tests. However, even if you no longer need a Pap test, a regular exam is a good idea to make sure no other problems are starting.   If you are between ages 15 and 95, and you have had normal Pap tests going back 10 years, you no longer need Pap tests. However, even if you no longer need a Pap test, a regular exam is a good idea to make sure no other problems are starting.   If you have had past treatment for cervical cancer or a condition that could lead to cancer, you need Pap tests and screening for cancer for at least 20 years after your treatment.  If Pap tests have been discontinued, risk factors (such as a new sexual partner) need to be re-assessed to determine if screening should be resumed.  Some women may need screenings more often if they are at high risk for cervical cancer.  Get mammograms done as per the advice of your caregiver. SEEK IMMEDIATE MEDICAL CARE IF:  You develop abnormal vaginal bleeding.  You have pain or swelling in your legs, shortness of breath, or chest pain.  You develop dizziness or headaches.  You have lumps or changes in your breasts or armpits.  You have slurred speech.  You develop weakness or numbness of your arms or legs.  You have pain, burning, or bleeding when urinating.  You develop abdominal pain. Document Released: 05/29/2003 Document Revised: 11/22/2011 Document Reviewed: 09/16/2010 [facility] Patient Information 2015 [place], [place]. This information is not intended to replace advice given to you by your health care  provider. Make sure you discuss any questions you have with your health care provider. Perimenopause Perimenopause is the time when your body begins to move into the menopause (no menstrual period for 12 straight months). It is a natural process. Perimenopause can begin 2-8 years before the menopause and usually lasts for 1 year after the menopause. During this time, your ovaries may or may not produce an egg. The ovaries vary in their production of estrogen and progesterone hormones each month. This can cause irregular menstrual periods, difficulty getting pregnant, vaginal bleeding between periods, and uncomfortable symptoms. CAUSES  Irregular production of the ovarian hormones, estrogen and progesterone,  and not ovulating every month.  Other causes include:  Tumor of the pituitary gland in the brain.  Medical disease that affects the ovaries.  Radiation treatment.  Chemotherapy.  Unknown causes.  Heavy smoking and excessive alcohol intake can bring on perimenopause sooner. SIGNS AND SYMPTOMS   Hot flashes.  Night sweats.  Irregular menstrual periods.  Decreased sex drive.  Vaginal dryness.  Headaches.  Mood swings.  Depression.  Memory problems.  Irritability.  Tiredness.  Weight gain.  Trouble getting pregnant.  The beginning of losing bone cells (osteoporosis).  The beginning of hardening of the arteries (atherosclerosis). DIAGNOSIS  Your health care provider will make a diagnosis by analyzing your age, menstrual history, and symptoms. He or she will do a physical exam and note any changes in your body, especially your female organs. Female hormone tests may or may not be helpful depending on the amount of female hormones you produce and when you produce them. However, other hormone tests may be helpful to rule out other problems. TREATMENT  In some cases, no treatment is needed. The decision on whether treatment is necessary during the perimenopause should  be made by you and your health care provider based on how the symptoms are affecting you and your lifestyle. Various treatments are available, such as:  Treating individual symptoms with a specific medicine for that symptom.  Herbal medicines that can help specific symptoms.  Counseling.  Group therapy. HOME CARE INSTRUCTIONS   Keep track of your menstrual periods (when they occur, how heavy they are, how long between periods, and how long they last) as well as your symptoms and when they started.  Only take over-the-counter or prescription medicines as directed by your health care provider.  Sleep and rest.  Exercise.  Eat a diet that contains calcium (good for your bones) and soy (acts like the estrogen hormone).  Do not smoke.  Avoid alcoholic beverages.  Take vitamin supplements as recommended by your health care provider. Taking vitamin E may help in certain cases.  Take calcium and vitamin D supplements to help prevent bone loss.  Group therapy is sometimes helpful.  Acupuncture may help in some cases. SEEK MEDICAL CARE IF:   You have questions about any symptoms you are having.  You need a referral to a specialist (gynecologist, psychiatrist, or psychologist). SEEK IMMEDIATE MEDICAL CARE IF:   You have vaginal bleeding.  Your period lasts longer than 8 days.  Your periods are recurring sooner than 21 days.  You have bleeding after intercourse.  You have severe depression.  You have pain when you urinate.  You have severe headaches.  You have vision problems. Document Released: 10/07/2004 Document Revised: 06/20/2013 Document Reviewed: 03/29/2013 [facility] Patient Information 2015 [place], [place]. This information is not intended to replace advice given to you by your health care provider. Make sure you discuss any questions you have with your health care provider.

## 2015-05-15 NOTE — Progress Notes (Signed)
Marcelino Duster Schermerhorn 1966/04/19 [id number]   History:    49 y.o.  for annual gyn exam who has not been seen in the office for the past 2 years. Patient complaining of constipation and hemorrhoids. Also patient complaining of hot flashes, irritability, mood swings and decreased libido. 2 years ago she had a normal FSH. Review of patient's record indicated back in 2012 she had been evaluated for a right groin pain to rule out inguinal hernia.but after CT scan they do not feel that she had a right inguinal hernia and she's been working on an exercise and the symptoms come and go and she's had for many years. Patient stated she had a menstrual cycle very light in December and then one in July. She stated her brother reached the menopause before the age of 76. Patient with no past history of any abnormal Pap smears.  Past medical history,surgical history, family history and social history were all reviewed and documented in the EPIC chart.  Gynecologic History Patient's last menstrual period was 03/27/2015. Contraception: vasectomy Last Pap: 2012. Results were: normal Last mammogram: 2016. Results were: normal  Obstetric History OB History  Gravida Para Term Preterm AB SAB TAB Ectopic Multiple Living  2 2        2     # Outcome Date GA Lbr Len/2nd Weight Sex Delivery Anes PTL Lv  2 Para           1 Para                ROS: A ROS was performed and pertinent positives and negatives are included in the history.  GENERAL: No fevers or chills. HEENT: No change in vision, no earache, sore throat or sinus congestion. NECK: No pain or stiffness. CARDIOVASCULAR: No chest pain or pressure. No palpitations. PULMONARY: No shortness of breath, cough or wheeze. GASTROINTESTINAL: No abdominal pain, nausea, vomiting or diarrhea, melena or bright red blood per rectum. GENITOURINARY: No urinary frequency, urgency, hesitancy or dysuria. MUSCULOSKELETAL: No joint or muscle pain, no back pain, no recent trauma.  DERMATOLOGIC: No rash, no itching, no lesions. ENDOCRINE: No polyuria, polydipsia, no heat or cold intolerance. No recent change in weight. HEMATOLOGICAL: No anemia or easy bruising or bleeding. NEUROLOGIC: No headache, seizures, numbness, tingling or weakness. PSYCHIATRIC: No depression, no loss of interest in normal activity or change in sleep pattern.     Exam: chaperone present  Ht 5' 4.5" (1.638 m)  Wt 148 lb (67.132 kg)  BMI 25.02 kg/m2  LMP 03/27/2015  Body mass index is 25.02 kg/(m^2).  General appearance : Well developed well nourished female. No acute distress HEENT: Eyes: no retinal hemorrhage or exudates,  Neck supple, trachea midline, no carotid bruits, no thyroidmegaly Lungs: Clear to auscultation, no rhonchi or wheezes, or rib retractions  Heart: Regular rate and rhythm, no murmurs or gallops Breast:Examined in sitting and supine position were symmetrical in appearance, no palpable masses or tenderness,  no skin retraction, no nipple inversion, no nipple discharge, no skin discoloration, no axillary or supraclavicular lymphadenopathy Abdomen: no palpable masses or tenderness, no rebound or guarding Extremities: no edema or skin discoloration or tenderness  Pelvic:  Bartholin, Urethra, Skene Glands: Within normal limits             Vagina: No gross lesions or discharge  Cervix: No gross lesions or discharge  Uterus  anteverted, normal size, shape and consistency, non-tender and mobile  Adnexa  Without masses or tenderness  Anus and perineum  normal   Rectovaginal  normal sphincter tone without palpated masses or tenderness             Hemoccult not indicated   Anoscopic exam did not demonstrate any internal/external hemorrhoids patient was reassured  Assessment/Plan:  49 y.o. female for annual exam with history of constipation and intermittent hemorrhoids. Patient will be started on  Linzess 145 g tablet daily. She was encouraged to increase her fluid intake and fiber  diet. Patient will return back next week in a fasting state for fasting blood work to include the following: [facility] metabolic panel, fasting lipid profile, TSH, CBC, and urinalysis. Also because of her vasomotor symptoms/and vein perimenopausal we'll check her [facility] as well. We discussed importance of calcium vitamin D and regular exercise for osteoporosis prevention. Her Pap smear was done today as well. She was reminded of the importance of monthly breast exam. She was provided literature information on the perimenopause as well as on hormone replacement therapy.   Ok Edwards MD, 4:18 PM 05/15/2015

## 2015-05-20 LAB — CYTOLOGY - PAP

## 2015-05-22 ENCOUNTER — Encounter: Payer: 59 | Admitting: Gynecology

## 2015-05-23 ENCOUNTER — Other Ambulatory Visit: Payer: BLUE CROSS/BLUE SHIELD

## 2015-05-23 ENCOUNTER — Other Ambulatory Visit: Payer: Self-pay

## 2015-05-23 DIAGNOSIS — Z1231 Encounter for screening mammogram for malignant neoplasm of breast: Secondary | ICD-10-CM

## 2015-05-23 DIAGNOSIS — N951 Menopausal and female climacteric states: Secondary | ICD-10-CM

## 2015-05-23 DIAGNOSIS — Z01419 Encounter for gynecological examination (general) (routine) without abnormal findings: Secondary | ICD-10-CM

## 2015-05-23 LAB — COMPREHENSIVE METABOLIC PANEL
ALT: 17 U/L (ref 6–29)
AST: 18 U/L (ref 10–35)
Albumin: 4.1 g/dL (ref 3.6–5.1)
Alkaline Phosphatase: 58 U/L (ref 33–115)
BUN: 16 mg/dL (ref 7–25)
CHLORIDE: 104 mmol/L (ref 98–110)
CO2: 29 mmol/L (ref 20–31)
CREATININE: 0.79 mg/dL (ref 0.50–1.10)
Calcium: 9.3 mg/dL (ref 8.6–10.2)
Glucose, Bld: 89 mg/dL (ref 65–99)
Potassium: 4.4 mmol/L (ref 3.5–5.3)
SODIUM: 141 mmol/L (ref 135–146)
Total Bilirubin: 0.6 mg/dL (ref 0.2–1.2)
Total Protein: 6.4 g/dL (ref 6.1–8.1)

## 2015-05-23 LAB — LIPID PANEL
CHOL/HDL RATIO: 3.5 ratio (ref <=5.0)
CHOLESTEROL: 187 mg/dL (ref 125–200)
HDL: 53 mg/dL (ref >=46)
LDL Cholesterol: 117 mg/dL (ref <130)
TRIGLYCERIDES: 83 mg/dL (ref <150)
VLDL: 17 mg/dL (ref <30)

## 2015-05-23 LAB — TSH: TSH: 1.104 u[IU]/mL (ref 0.350–4.500)

## 2015-05-24 LAB — CBC WITH DIFFERENTIAL/PLATELET
BASOS ABS: 0.1 10*3/uL (ref 0.0–0.1)
BASOS PCT: 1 % (ref 0–1)
Eosinophils Absolute: 0.1 10*3/uL (ref 0.0–0.7)
Eosinophils Relative: 2 % (ref 0–5)
HCT: 42.6 % (ref 36.0–46.0)
HEMOGLOBIN: 14.4 g/dL (ref 12.0–15.0)
LYMPHS ABS: 2.1 10*3/uL (ref 0.7–4.0)
Lymphocytes Relative: 39 % (ref 12–46)
MCH: 29.2 pg (ref 26.0–34.0)
MCHC: 33.8 g/dL (ref 30.0–36.0)
MCV: 86.4 fL (ref 78.0–100.0)
MONOS PCT: 9 % (ref 3–12)
MPV: 9.3 fL (ref 8.6–12.4)
Monocytes Absolute: 0.5 10*3/uL (ref 0.1–1.0)
NEUTROS ABS: 2.6 10*3/uL (ref 1.7–7.7)
NEUTROS PCT: 49 % (ref 43–77)
Platelets: 315 10*3/uL (ref 150–400)
RBC: 4.93 MIL/uL (ref 3.87–5.11)
RDW: 12.8 % (ref 11.5–15.5)
WBC: 5.3 10*3/uL (ref 4.0–10.5)

## 2015-05-24 LAB — URINALYSIS W MICROSCOPIC + REFLEX CULTURE
BILIRUBIN URINE: NEGATIVE
Casts: NONE SEEN [LPF]
GLUCOSE, UA: NEGATIVE
Hgb urine dipstick: NEGATIVE
Ketones, ur: NEGATIVE
Nitrite: NEGATIVE
Protein, ur: NEGATIVE
SPECIFIC GRAVITY, URINE: 1.024 (ref 1.001–1.035)
Yeast: NONE SEEN [HPF]
pH: 5 (ref 5.0–8.0)

## 2015-05-24 LAB — FOLLICLE STIMULATING HORMONE: FSH: 89.9 m[IU]/mL

## 2015-05-25 LAB — URINE CULTURE
COLONY COUNT: NO GROWTH
ORGANISM ID, BACTERIA: NO GROWTH

## 2015-06-23 ENCOUNTER — Ambulatory Visit: Payer: BLUE CROSS/BLUE SHIELD | Admitting: Gynecology

## 2015-06-25 ENCOUNTER — Ambulatory Visit: Payer: BLUE CROSS/BLUE SHIELD

## 2015-06-27 ENCOUNTER — Ambulatory Visit: Payer: BLUE CROSS/BLUE SHIELD | Admitting: Gynecology

## 2015-07-24 ENCOUNTER — Ambulatory Visit
Admission: RE | Admit: 2015-07-24 | Discharge: 2015-07-24 | Disposition: A | Discharge status: Home / Self Care | Payer: BLUE CROSS/BLUE SHIELD | Source: Ambulatory Visit

## 2015-07-24 DIAGNOSIS — Z1231 Encounter for screening mammogram for malignant neoplasm of breast: Secondary | ICD-10-CM

## 2015-08-13 ENCOUNTER — Ambulatory Visit (INDEPENDENT_AMBULATORY_CARE_PROVIDER_SITE_OTHER): Payer: BLUE CROSS/BLUE SHIELD | Admitting: Gynecology

## 2015-08-13 ENCOUNTER — Encounter: Payer: Self-pay | Admitting: Gynecology

## 2015-08-13 VITALS — BP 108/68

## 2015-08-13 DIAGNOSIS — Z23 Encounter for immunization: Secondary | ICD-10-CM

## 2015-08-13 DIAGNOSIS — K5900 Constipation, unspecified: Secondary | ICD-10-CM | POA: Diagnosis not present

## 2015-08-13 DIAGNOSIS — Z780 Asymptomatic menopausal state: Secondary | ICD-10-CM | POA: Diagnosis not present

## 2015-08-13 NOTE — Addendum Note (Signed)
Addended by: Kem ParkinsonBARNES, Shaunita Seney on: 08/13/2015 02:22 PM   Modules accepted: Orders

## 2015-08-13 NOTE — Patient Instructions (Signed)
Hormone Therapy At menopause, your body begins making less estrogen and progesterone hormones. This causes the body to stop having menstrual periods. This is because estrogen and progesterone hormones control your periods and menstrual cycle. A lack of estrogen may cause symptoms such as:  Hot flushes (or hot flashes).  Vaginal dryness.  Dry skin.  Loss of sex drive.  Risk of bone loss (osteoporosis). When this happens, you may choose to take hormone therapy to get back the estrogen lost during menopause. When the hormone estrogen is given alone, it is usually referred to as ET (Estrogen Therapy). When the hormone progestin is combined with estrogen, it is generally called HT (Hormone Therapy). This was formerly known as hormone replacement therapy (HRT). Your caregiver can help you make a decision on what will be best for you. The decision to use HT seems to change often as new studies are done. Many studies do not agree on the benefits of hormone replacement therapy. LIKELY BENEFITS OF HT INCLUDE PROTECTION FROM:  Hot Flushes (also called hot flashes) - A hot flush is a sudden feeling of heat that spreads over the face and body. The skin may redden like a blush. It is connected with sweats and sleep disturbance. Women going through menopause may have hot flushes a few times a month or several times per day depending on the woman.  Osteoporosis (bone loss) - Estrogen helps guard against bone loss. After menopause, a woman's bones slowly lose calcium and become weak and brittle. As a result, bones are more likely to break. The hip, wrist, and spine are affected most often. Hormone therapy can help slow bone loss after menopause. Weight bearing exercise and taking calcium with vitamin D also can help prevent bone loss. There are also medications that your caregiver can prescribe that can help prevent osteoporosis.  Vaginal dryness - Loss of estrogen causes changes in the vagina. Its lining may  become thin and dry. These changes can cause pain and bleeding during sexual intercourse. Dryness can also lead to infections. This can cause burning and itching. (Vaginal estrogen treatment can help relieve pain, itching, and dryness.)  Urinary tract infections are more common after menopause because of lack of estrogen. Some women also develop urinary incontinence because of low estrogen levels in the vagina and bladder.  Possible other benefits of estrogen include a positive effect on mood and short-term memory in women. RISKS AND COMPLICATIONS  Using estrogen alone without progesterone causes the lining of the uterus to grow. This increases the risk of lining of the uterus (endometrial) cancer. Your caregiver should give another hormone called progestin if you have a uterus.  Women who take combined (estrogen and progestin) HT appear to have an increased risk of breast cancer. The risk appears to be small, but increases throughout the time that HT is taken.  Combined therapy also makes the breast tissue slightly denser which makes it harder to read mammograms (breast X-rays).  Combined, estrogen and progesterone therapy can be taken together every day, in which case there may be spotting of blood. HT therapy can be taken cyclically in which case you will have menstrual periods. Cyclically means HT is taken for a set amount of days, then not taken, then this process is repeated.  HT may increase the risk of stroke, heart attack, breast cancer and forming blood clots in your leg.  Transdermal estrogen (estrogen that is absorbed through the skin with a patch or a cream) may have better results with:    Cholesterol.  Blood pressure.  Blood clots. Having the following conditions may indicate you should not have HT:  Endometrial cancer.  Liver disease.  Breast cancer.  Heart disease.  History of blood clots.  Stroke. TREATMENT   If you choose to take HT and have a uterus, usually  estrogen and progestin are prescribed.  Your caregiver will help you decide the best way to take the medications.  Possible ways to take estrogen include:  Pills.  Patches.  Gels.  Sprays.  Vaginal estrogen cream, rings and tablets.  It is best to take the lowest dose possible that will help your symptoms and take them for the shortest period of time that you can.  Hormone therapy can help relieve some of the problems (symptoms) that affect women at menopause. Before making a decision about HT, talk to your caregiver about what is best for you. Be well informed and comfortable with your decisions. HOME CARE INSTRUCTIONS   Follow your caregivers advice when taking the medications.  A Pap test is done to screen for cervical cancer.  The first Pap test should be done at age 21.  Between ages 21 and 29, Pap tests are repeated every 2 years.  Beginning at age 30, you are advised to have a Pap test every 3 years as long as the past 3 Pap tests have been normal.  Some women have medical problems that increase the chance of getting cervical cancer. Talk to your caregiver about these problems. It is especially important to talk to your caregiver if a new problem develops soon after your last Pap test. In these cases, your caregiver may recommend more frequent screening and Pap tests.  The above recommendations are the same for women who have or have not gotten the vaccine for HPV (human papillomavirus).  If you had a hysterectomy for a problem that was not a cancer or a condition that could lead to cancer, then you no longer need Pap tests. However, even if you no longer need a Pap test, a regular exam is a good idea to make sure no other problems are starting.  If you are between ages 65 and 70, and you have had normal Pap tests going back 10 years, you no longer need Pap tests. However, even if you no longer need a Pap test, a regular exam is a good idea to make sure no other problems  are starting.  If you have had past treatment for cervical cancer or a condition that could lead to cancer, you need Pap tests and screening for cancer for at least 20 years after your treatment.  If Pap tests have been discontinued, risk factors (such as a new sexual partner)need to be re-assessed to determine if screening should be resumed.  Some women may need screenings more often if they are at high risk for cervical cancer.  Get mammograms done as per the advice of your caregiver. SEEK IMMEDIATE MEDICAL CARE IF:  You develop abnormal vaginal bleeding.  You have pain or swelling in your legs, shortness of breath, or chest pain.  You develop dizziness or headaches.  You have lumps or changes in your breasts or armpits.  You have slurred speech.  You develop weakness or numbness of your arms or legs.  You have pain, burning, or bleeding when urinating.  You develop abdominal pain.   This information is not intended to replace advice given to you by your health care provider. Make sure you discuss any questions   you have with your health care provider.   Document Released: 05/29/2003 Document Revised: 01/14/2015 Document Reviewed: 03/03/2015 Elsevier Interactive Patient Education 2016 [employer] Menopause Menopause is the normal time of life when menstrual periods stop completely. Menopause is complete when you have missed 12 consecutive menstrual periods. It usually occurs between the ages of 48 years and 55 years. Very rarely does a woman develop menopause before the age of 40 years. At menopause, your ovaries stop producing the female hormones estrogen and progesterone. This can cause undesirable symptoms and also affect your health. Sometimes the symptoms may occur 4-5 years before the menopause begins. There is no relationship between menopause and:  Oral contraceptives.  Number of children you had.  Race.  The age your menstrual periods started (menarche). Heavy  smokers and very thin women may develop menopause earlier in life. CAUSES  The ovaries stop producing the female hormones estrogen and progesterone.  Other causes include:  Surgery to remove both ovaries.  The ovaries stop functioning for no known reason.  Tumors of the pituitary gland in the brain.  Medical disease that affects the ovaries and hormone production.  Radiation treatment to the abdomen or pelvis.  Chemotherapy that affects the ovaries. SYMPTOMS   Hot flashes.  Night sweats.  Decrease in sex drive.  Vaginal dryness and thinning of the vagina causing painful intercourse.  Dryness of the skin and developing wrinkles.  Headaches.  Tiredness.  Irritability.  Memory problems.  Weight gain.  Bladder infections.  Hair growth of the face and chest.  Infertility. More serious symptoms include:  Loss of bone (osteoporosis) causing breaks (fractures).  Depression.  Hardening and narrowing of the arteries (atherosclerosis) causing heart attacks and strokes. DIAGNOSIS   When the menstrual periods have stopped for 12 straight months.  Physical exam.  Hormone studies of the blood. TREATMENT  There are many treatment choices and nearly as many questions about them. The decisions to treat or not to treat menopausal changes is an individual choice made with your health care provider. Your health care provider can discuss the treatments with you. Together, you can decide which treatment will work best for you. Your treatment choices may include:   Hormone therapy (estrogen and progesterone).  Non-hormonal medicines.  Treating the individual symptoms with medicine (for example antidepressants for depression).  Herbal medicines that may help specific symptoms.  Counseling by a psychiatrist or psychologist.  Group therapy.  Lifestyle changes including:  Eating healthy.  Regular exercise.  Limiting caffeine and alcohol.  Stress management and  meditation.  No treatment. HOME CARE INSTRUCTIONS   Take the medicine your health care provider gives you as directed.  Get plenty of sleep and rest.  Exercise regularly.  Eat a diet that contains calcium (good for the bones) and soy products (acts like estrogen hormone).  Avoid alcoholic beverages.  Do not smoke.  If you have hot flashes, dress in layers.  Take supplements, calcium, and vitamin D to strengthen bones.  You can use over-the-counter lubricants or moisturizers for vaginal dryness.  Group therapy is sometimes very helpful.  Acupuncture may be helpful in some cases. SEEK MEDICAL CARE IF:   You are not sure you are in menopause.  You are having menopausal symptoms and need advice and treatment.  You are still having menstrual periods after age 55 years.  You have pain with intercourse.  Menopause is complete (no menstrual period for 12 months) and you develop vaginal bleeding.  You need a referral to   a specialist (gynecologist, psychiatrist, or psychologist) for treatment. SEEK IMMEDIATE MEDICAL CARE IF:   You have severe depression.  You have excessive vaginal bleeding.  You fell and think you have a broken bone.  You have pain when you urinate.  You develop leg or chest pain.  You have a fast pounding heart beat (palpitations).  You have severe headaches.  You develop vision problems.  You feel a lump in your breast.  You have abdominal pain or severe indigestion.   This information is not intended to replace advice given to you by your health care provider. Make sure you discuss any questions you have with your health care provider.   Document Released: 11/20/2003 Document Revised: 05/02/2013 Document Reviewed: 03/29/2013 Elsevier Interactive Patient Education 2016 [employer] Inc[employer]  

## 2015-08-13 NOTE — Progress Notes (Signed)
   Patient presented to the office today to discuss her recent lab work. Patient was seen in the office on September 1 for her annual gynecological examination. At that time she was complaining of hot flashes, irritability, mood swings and decreased libido. 2 years ago she had a normal FSH. She states that for the past couple months she has not had these vasomotor symptoms. Her FSH drawn at time of her annual exam September 2016 demonstrated that it was in the menopausal range with a value of 89.9. Her CBC, comprehensive metabolic panel, lipid profile, and TSH were all normal. She had been suffering at that time from chronic constipation and I had prescribed her Linzess 145 g tablet daily which she states has helped her but sometime her stools are loose. Her recent Pap smear was normal as was her mammogram.  We had a lengthy discussion today on the menopause. We also discussed women's health initiative study. We also discussed the risk benefits and pros and cons of hormone replacement therapy. We discussed different routes of administration. Patient would like to wait since she is not having any symptoms now. In the meantime I'm going to offer her to take Relizen a nutritional supplement twice a day which will help with her vasomotor symptoms. She also continues peppermint oil to apply behind her ear to 3 times a day when necessary. I've also provided her with website for additional information on the menopause from the [employer] menopausal society as well as from the [employer]. She was reminded also reports of calcium and vitamin D and regular exercise for osteoporosis prevention. Asked for her loose stool I would recommend that she take the Li every other day.nzess    Greater than 90% of the time was spent discussing the menopause as well as treatment options as well as constipation.

## 2016-06-15 ENCOUNTER — Other Ambulatory Visit: Payer: Self-pay | Admitting: Gynecology

## 2016-06-15 DIAGNOSIS — Z1231 Encounter for screening mammogram for malignant neoplasm of breast: Secondary | ICD-10-CM

## 2016-07-23 ENCOUNTER — Ambulatory Visit (INDEPENDENT_AMBULATORY_CARE_PROVIDER_SITE_OTHER): Payer: BLUE CROSS/BLUE SHIELD | Admitting: Gynecology

## 2016-07-23 ENCOUNTER — Encounter: Payer: Self-pay | Admitting: Gynecology

## 2016-07-23 VITALS — BP 114/72 | Ht 64.5 in | Wt 153.0 lb

## 2016-07-23 DIAGNOSIS — Z23 Encounter for immunization: Secondary | ICD-10-CM

## 2016-07-23 DIAGNOSIS — Z01411 Encounter for gynecological examination (general) (routine) with abnormal findings: Secondary | ICD-10-CM

## 2016-07-23 DIAGNOSIS — K5900 Constipation, unspecified: Secondary | ICD-10-CM

## 2016-07-23 DIAGNOSIS — N951 Menopausal and female climacteric states: Secondary | ICD-10-CM | POA: Insufficient documentation

## 2016-07-23 DIAGNOSIS — Z79890 Hormone replacement therapy (postmenopausal): Secondary | ICD-10-CM | POA: Diagnosis not present

## 2016-07-23 LAB — CBC WITH DIFFERENTIAL/PLATELET
BASOS PCT: 1 %
Basophils Absolute: 52 cells/uL (ref 0–200)
EOS ABS: 104 {cells}/uL (ref 15–500)
Eosinophils Relative: 2 %
HEMATOCRIT: 41.7 % (ref 35.0–45.0)
HEMOGLOBIN: 14 g/dL (ref 11.7–15.5)
LYMPHS ABS: 2028 {cells}/uL (ref 850–3900)
Lymphocytes Relative: 39 %
MCH: 29.5 pg (ref 27.0–33.0)
MCHC: 33.6 g/dL (ref 32.0–36.0)
MCV: 88 fL (ref 80.0–100.0)
MONO ABS: 468 {cells}/uL (ref 200–950)
MONOS PCT: 9 %
MPV: 9.5 fL (ref 7.5–12.5)
NEUTROS ABS: 2548 {cells}/uL (ref 1500–7800)
Neutrophils Relative %: 49 %
PLATELETS: 280 10*3/uL (ref 140–400)
RBC: 4.74 MIL/uL (ref 3.80–5.10)
RDW: 13.1 % (ref 11.0–15.0)
WBC: 5.2 10*3/uL (ref 3.8–10.8)

## 2016-07-23 LAB — LIPID PANEL
CHOL/HDL RATIO: 2.9 ratio (ref <5.0)
CHOLESTEROL: 174 mg/dL (ref <200)
HDL: 60 mg/dL (ref >50)
LDL Cholesterol: 101 mg/dL — ABNORMAL HIGH (ref <100)
Triglycerides: 63 mg/dL (ref <150)
VLDL: 13 mg/dL (ref <30)

## 2016-07-23 LAB — COMPREHENSIVE METABOLIC PANEL
ALBUMIN: 4.4 g/dL (ref 3.6–5.1)
ALK PHOS: 83 U/L (ref 33–115)
ALT: 11 U/L (ref 6–29)
AST: 16 U/L (ref 10–35)
BUN: 14 mg/dL (ref 7–25)
CO2: 24 mmol/L (ref 20–31)
CREATININE: 0.79 mg/dL (ref 0.50–1.10)
Calcium: 9.3 mg/dL (ref 8.6–10.2)
Chloride: 108 mmol/L (ref 98–110)
Glucose, Bld: 100 mg/dL — ABNORMAL HIGH (ref 65–99)
POTASSIUM: 4.2 mmol/L (ref 3.5–5.3)
Sodium: 144 mmol/L (ref 135–146)
TOTAL PROTEIN: 6.4 g/dL (ref 6.1–8.1)
Total Bilirubin: 0.4 mg/dL (ref 0.2–1.2)

## 2016-07-23 LAB — TSH: TSH: 0.82 mIU/L

## 2016-07-23 MED ORDER — LINACLOTIDE 145 MCG PO CAPS: 145.0000 ug | ORAL_CAPSULE | Freq: Every day | ORAL | 11 refills | Status: AC

## 2016-07-23 MED ORDER — ESTRADIOL 0.5 MG PO TABS: 0.5000 mg | ORAL_TABLET | Freq: Every day | ORAL | 11 refills | Status: DC

## 2016-07-23 MED ORDER — PROGESTERONE MICRONIZED 200 MG PO CAPS: 200.0000 mg | ORAL_CAPSULE | Freq: Every day | ORAL | 4 refills | Status: AC

## 2016-07-23 NOTE — Addendum Note (Signed)
Addended by: Ok EdwardsFERNANDEZ, Dickie Labarre H on: 07/23/2016 09:05 AM   Modules accepted: Orders

## 2016-07-23 NOTE — Patient Instructions (Addendum)
Influenza Virus Vaccine (Flucelvax) What is this medicine? INFLUENZA VIRUS VACCINE (in floo EN zuh VAHY ruhs vak SEEN) helps to reduce the risk of getting influenza also known as the flu. The vaccine only helps protect you against some strains of the flu. This medicine may be used for other purposes; ask your health care provider or pharmacist if you have questions. What should I tell my health care provider before I take this medicine? They need to know if you have any of these conditions: -bleeding disorder like hemophilia -fever or infection -Guillain-Barre syndrome or other neurological problems -immune system problems -infection with the human immunodeficiency virus (HIV) or AIDS -low blood platelet counts -multiple sclerosis -an unusual or allergic reaction to influenza virus vaccine, other medicines, foods, dyes or preservatives -pregnant or trying to get pregnant -breast-feeding How should I use this medicine? This vaccine is for injection into a muscle. It is given by a health care professional. A copy of Vaccine Information Statements will be given before each vaccination. Read this sheet carefully each time. The sheet may change frequently. Talk to your pediatrician regarding the use of this medicine in children. Special care may be needed. Overdosage: If you think you've taken too much of this medicine contact a poison control center or emergency room at once. Overdosage: If you think you have taken too much of this medicine contact a poison control center or emergency room at once. NOTE: This medicine is only for you. Do not share this medicine with others. What if I miss a dose? This does not apply. What may interact with this medicine? -chemotherapy or radiation therapy -medicines that lower your immune system like etanercept, anakinra, infliximab, and adalimumab -medicines that treat or prevent blood clots like warfarin -phenytoin -steroid medicines like prednisone or  cortisone -theophylline -vaccines This list may not describe all possible interactions. Give your health care provider a list of all the medicines, herbs, non-prescription drugs, or dietary supplements you use. Also tell them if you smoke, drink alcohol, or use illegal drugs. Some items may interact with your medicine. What should I watch for while using this medicine? Report any side effects that do not go away within 3 days to your doctor or health care professional. Call your health care provider if any unusual symptoms occur within 6 weeks of receiving this vaccine. You may still catch the flu, but the illness is not usually as bad. You cannot get the flu from the vaccine. The vaccine will not protect against colds or other illnesses that may cause fever. The vaccine is needed every year. What side effects may I notice from receiving this medicine? Side effects that you should report to your doctor or health care professional as soon as possible: -allergic reactions like skin rash, itching or hives, swelling of the face, lips, or tongue Side effects that usually do not require medical attention (Report these to your doctor or health care professional if they continue or are bothersome.): -fever -headache -muscle aches and pains -pain, tenderness, redness, or swelling at the injection site -tiredness This list may not describe all possible side effects. Call your doctor for medical advice about side effects. You may report side effects to FDA at 1-800-FDA-1088. Where should I keep my medicine? The vaccine will be given by a health care professional in a clinic, pharmacy, doctor's office, or other health care setting. You will not be given vaccine doses to store at home. NOTE: This sheet is a summary. It may not cover   all possible information. If you have questions about this medicine, talk to your doctor, pharmacist, or health care provider.    2016, Elsevier/Gold Standard. (2011-08-11  14:06:47) Progesterone capsules What is this medicine? PROGESTERONE (proe JES ter one) is a female hormone. This medicine is used to prevent the overgrowth of the lining of the uterus in women who are taking estrogens for the symptoms of menopause. It is also used to treat secondary amenorrhea. This is when a woman stops getting menstrual periods due to low levels of progesterone. This medicine may be used for other purposes; ask your health care provider or pharmacist if you have questions. What should I tell my health care provider before I take this medicine? They need to know if you have any of these conditions: -autoimmune disease like systemic lupus erythematosus (SLE) -blood vessel disease, blood clotting disorder, or suffered a stroke -breast, cervical or vaginal cancer -dementia -diabetes -kidney or liver disease -heart disease, high blood pressure or recent heart attack -high blood lipids or cholesterol -hysterectomy -recent miscarriage -tobacco smoker -vaginal bleeding -an unusual or allergic reaction to progesterone, peanuts, other medicines, foods, dyes, or preservatives -pregnant or trying to get pregnant -breast-feeding How should I use this medicine? Take this medicine by mouth with a glass of water. Follow the directions on the prescription label. Take your doses at regular intervals. Do not take your medicine more often than directed. Talk to your pediatrician regarding the use of this medicine in children. Special care may be needed. A patient package insert for the product will be given with each prescription and refill. Read this sheet carefully each time. The sheet may change frequently. Overdosage: If you think you have taken too much of this medicine contact a poison control center or emergency room at once. NOTE: This medicine is only for you. Do not share this medicine with others. What if I miss a dose? If you miss a dose, take it as soon as you can. If it is  almost time for your next dose, take only that dose. Do not take double or extra doses. What may interact with this medicine? Do not take this medicine with any of the following medications: -bosentan This medicine may also interact with the following medications: -barbiturate medicines for sleep or seizures -bexarotene -carbamazepine -ethotoin -ketoconazole -phenytoin -rifampin This list may not describe all possible interactions. Give your health care provider a list of all the medicines, herbs, non-prescription drugs, or dietary supplements you use. Also tell them if you smoke, drink alcohol, or use illegal drugs. Some items may interact with your medicine. What should I watch for while using this medicine? Visit your doctor or health care professional for regular checks on your progress. This medicine can cause swelling, tenderness, or bleeding of the gums. Be careful when brushing and flossing teeth. See your dentist regularly for routine dental care. You may get drowsy or dizzy. Do not drive, use machinery, or do anything that needs mental alertness until you know how this drug affects you. Do not stand or sit up quickly, especially if you are an older patient. This reduces the risk of dizzy or fainting spells. What side effects may I notice from receiving this medicine? Side effects that you should report to your doctor or health care professional as soon as possible: -allergic reactions like skin rash, itching or hives, swelling of the face, lips, or tongue -breast tissue changes or discharge -changes in vaginal bleeding during your period or between your periods -  depression -muscle or bone pain -numbness or pain in the arm or leg -pain in the chest, groin or leg -seizures or tremors -severe headache -stomach pain -sudden shortness of breath -unusually weak or tired -vision or speech problems -yellowing of skin or eyes Side effects that usually do not require medical  attention (report to your doctor or health care professional if they continue or are bothersome): -acne -fluid retention and swelling -increased in appetite -mood changes, anxiety, depression, frustration, anger, or emotional outbursts -nausea, vomiting -sweating or hot flashes This list may not describe all possible side effects. Call your doctor for medical advice about side effects. You may report side effects to FDA at 1-800-FDA-1088. Where should I keep my medicine? Keep out of the reach of children. Store at room temperature between 15 and 30 degrees C (59 and 86 degrees F). Protect from light. Keep container tightly closed. Throw away any unused medicine after the expiration date. NOTE: This sheet is a summary. It may not cover all possible information. If you have questions about this medicine, talk to your doctor, pharmacist, or health care provider.    2016, Elsevier/Gold Standard. (2008-08-15 11:43:48) Estradiol tablets What is this medicine? ESTRADIOL (es tra DYE ole) is an estrogen. It is mostly used as hormone replacement in menopausal women. It helps to treat hot flashes and prevent osteoporosis. It is also used to treat women with low estrogen levels or those who have had their ovaries removed. This medicine may be used for other purposes; ask your health care provider or pharmacist if you have questions. What should I tell my health care provider before I take this medicine? They need to know if you have or ever had any of these conditions: -abnormal vaginal bleeding -blood vessel disease or blood clots -breast, cervical, endometrial, ovarian, liver, or uterine cancer -dementia -diabetes -gallbladder disease -heart disease or recent heart attack -high blood pressure -high cholesterol -high level of calcium in the blood -hysterectomy -kidney disease -liver disease -migraine headaches -protein C deficiency -protein S deficiency -stroke -systemic lupus  erythematosus (SLE) -tobacco smoker -an unusual or allergic reaction to estrogens, other hormones, medicines, foods, dyes, or preservatives -pregnant or trying to get pregnant -breast-feeding How should I use this medicine? Take this medicine by mouth. To reduce nausea, this medicine may be taken with food. Follow the directions on the prescription label. Take this medicine at the same time each day and in the order directed on the package. Do not take your medicine more often than directed. Contact your pediatrician regarding the use of this medicine in children. Special care may be needed. A patient package insert for the product will be given with each prescription and refill. Read this sheet carefully each time. The sheet may change frequently. Overdosage: If you think you have taken too much of this medicine contact a poison control center or emergency room at once. NOTE: This medicine is only for you. Do not share this medicine with others. What if I miss a dose? If you miss a dose, take it as soon as you can. If it is almost time for your next dose, take only that dose. Do not take double or extra doses. What may interact with this medicine? Do not take this medicine with any of the following medications: -aromatase inhibitors like aminoglutethimide, anastrozole, exemestane, letrozole, testolactone This medicine may also interact with the following medications: -carbamazepine -certain antibiotics used to treat infections -certain barbiturates or benzodiazepines used for inducing sleep or treating seizures -  grapefruit juice -medicines for fungus infections like itraconazole and ketoconazole -raloxifene or tamoxifen -rifabutin, rifampin, or rifapentine -ritonavir -St. John's Wort -warfarin This list may not describe all possible interactions. Give your health care provider a list of all the medicines, herbs, non-prescription drugs, or dietary supplements you use. Also tell them if you  smoke, drink alcohol, or use illegal drugs. Some items may interact with your medicine. What should I watch for while using this medicine? Visit your doctor or health care professional for regular checks on your progress. You will need a regular breast and pelvic exam and Pap smear while on this medicine. You should also discuss the need for regular mammograms with your health care professional, and follow his or her guidelines for these tests. This medicine can make your body retain fluid, making your fingers, hands, or ankles swell. Your blood pressure can go up. Contact your doctor or health care professional if you feel you are retaining fluid. If you have any reason to think you are pregnant, stop taking this medicine right away and contact your doctor or health care professional. Smoking increases the risk of getting a blood clot or having a stroke while you are taking this medicine, especially if you are more than 49 years old. You are strongly advised not to smoke. If you wear contact lenses and notice visual changes, or if the lenses begin to feel uncomfortable, consult your eye doctor or health care professional. This medicine can increase the risk of developing a condition (endometrial hyperplasia) that may lead to cancer of the lining of the uterus. Taking progestins, another hormone drug, with this medicine lowers the risk of developing this condition. Therefore, if your uterus has not been removed (by a hysterectomy), your doctor may prescribe a progestin for you to take together with your estrogen. You should know, however, that taking estrogens with progestins may have additional health risks. You should discuss the use of estrogens and progestins with your health care professional to determine the benefits and risks for you. If you are going to have surgery, you may need to stop taking this medicine. Consult your health care professional for advice before you schedule the surgery. What side  effects may I notice from receiving this medicine? Side effects that you should report to your doctor or health care professional as soon as possible: -allergic reactions like skin rash, itching or hives, swelling of the face, lips, or tongue -breast tissue changes or discharge -changes in vision -chest pain -confusion, trouble speaking or understanding -dark urine -general ill feeling or flu-like symptoms -light-colored stools -nausea, vomiting -pain, swelling, warmth in the leg -right upper belly pain -severe headaches -shortness of breath -sudden numbness or weakness of the face, arm or leg -trouble walking, dizziness, loss of balance or coordination -unusual vaginal bleeding -yellowing of the eyes or skin Side effects that usually do not require medical attention (report to your doctor or health care professional if they continue or are bothersome): -hair loss -increased hunger or thirst -increased urination -symptoms of vaginal infection like itching, irritation or unusual discharge -unusually weak or tired This list may not describe all possible side effects. Call your doctor for medical advice about side effects. You may report side effects to FDA at 1-800-FDA-1088. Where should I keep my medicine? Keep out of the reach of children. Store at room temperature between 20 and 25 degrees C (68 and 77 degrees F). Keep container tightly closed. Protect from light. Throw away any unused medicine after  the expiration date. NOTE: This sheet is a summary. It may not cover all possible information. If you have questions about this medicine, talk to your doctor, pharmacist, or health care provider.    2016, Elsevier/Gold Standard. (2010-12-02 16:10:96) Hormone Therapy At menopause, your body begins making less estrogen and progesterone hormones. This causes the body to stop having menstrual periods. This is because estrogen and progesterone hormones control your periods and menstrual  cycle. A lack of estrogen may cause symptoms such as:  Hot flushes (or hot flashes).  Vaginal dryness.  Dry skin.  Loss of sex drive.  Risk of bone loss (osteoporosis). When this happens, you may choose to take hormone therapy to get back the estrogen lost during menopause. When the hormone estrogen is given alone, it is usually referred to as ET (Estrogen Therapy). When the hormone progestin is combined with estrogen, it is generally called HT (Hormone Therapy). This was formerly known as hormone replacement therapy (HRT). Your caregiver can help you make a decision on what will be best for you. The decision to use HT seems to change often as new studies are done. Many studies do not agree on the benefits of hormone replacement therapy. LIKELY BENEFITS OF HT INCLUDE PROTECTION FROM:  Hot Flushes (also called hot flashes) - A hot flush is a sudden feeling of heat that spreads over the face and body. The skin may redden like a blush. It is connected with sweats and sleep disturbance. Women going through menopause may have hot flushes a few times a month or several times per day depending on the woman.  Osteoporosis (bone loss) - Estrogen helps guard against bone loss. After menopause, a woman's bones slowly lose calcium and become weak and brittle. As a result, bones are more likely to break. The hip, wrist, and spine are affected most often. Hormone therapy can help slow bone loss after menopause. Weight bearing exercise and taking calcium with vitamin D also can help prevent bone loss. There are also medications that your caregiver can prescribe that can help prevent osteoporosis.  Vaginal dryness - Loss of estrogen causes changes in the vagina. Its lining may become thin and dry. These changes can cause pain and bleeding during sexual intercourse. Dryness can also lead to infections. This can cause burning and itching. (Vaginal estrogen treatment can help relieve pain, itching, and  dryness.)  Urinary tract infections are more common after menopause because of lack of estrogen. Some women also develop urinary incontinence because of low estrogen levels in the vagina and bladder.  Possible other benefits of estrogen include a positive effect on mood and short-term memory in women. RISKS AND COMPLICATIONS  Using estrogen alone without progesterone causes the lining of the uterus to grow. This increases the risk of lining of the uterus (endometrial) cancer. Your caregiver should give another hormone called progestin if you have a uterus.  Women who take combined (estrogen and progestin) HT appear to have an increased risk of breast cancer. The risk appears to be small, but increases throughout the time that HT is taken.  Combined therapy also makes the breast tissue slightly denser which makes it harder to read mammograms (breast X-rays).  Combined, estrogen and progesterone therapy can be taken together every day, in which case there may be spotting of blood. HT therapy can be taken cyclically in which case you will have menstrual periods. Cyclically means HT is taken for a set amount of days, then not taken, then this process is  repeated.  HT may increase the risk of stroke, heart attack, breast cancer and forming blood clots in your leg.  Transdermal estrogen (estrogen that is absorbed through the skin with a patch or a cream) may have better results with:  Cholesterol.  Blood pressure.  Blood clots. Having the following conditions may indicate you should not have HT:  Endometrial cancer.  Liver disease.  Breast cancer.  Heart disease.  History of blood clots.  Stroke. TREATMENT   If you choose to take HT and have a uterus, usually estrogen and progestin are prescribed.  Your caregiver will help you decide the best way to take the medications.  Possible ways to take estrogen include:  Pills.  Patches.  Gels.  Sprays.  Vaginal estrogen cream,  rings and tablets.  It is best to take the lowest dose possible that will help your symptoms and take them for the shortest period of time that you can.  Hormone therapy can help relieve some of the problems (symptoms) that affect women at menopause. Before making a decision about HT, talk to your caregiver about what is best for you. Be well informed and comfortable with your decisions. HOME CARE INSTRUCTIONS   Follow your caregivers advice when taking the medications.  A Pap test is done to screen for cervical cancer.  The first Pap test should be done at age 67.  Between ages 34 and 68, Pap tests are repeated every 2 years.  Beginning at age 41, you are advised to have a Pap test every 3 years as long as the past 3 Pap tests have been normal.  Some women have medical problems that increase the chance of getting cervical cancer. Talk to your caregiver about these problems. It is especially important to talk to your caregiver if a new problem develops soon after your last Pap test. In these cases, your caregiver may recommend more frequent screening and Pap tests.  The above recommendations are the same for women who have or have not gotten the vaccine for HPV (human papillomavirus).  If you had a hysterectomy for a problem that was not a cancer or a condition that could lead to cancer, then you no longer need Pap tests. However, even if you no longer need a Pap test, a regular exam is a good idea to make sure no other problems are starting.  If you are between ages 51 and 37, and you have had normal Pap tests going back 10 years, you no longer need Pap tests. However, even if you no longer need a Pap test, a regular exam is a good idea to make sure no other problems are starting.  If you have had past treatment for cervical cancer or a condition that could lead to cancer, you need Pap tests and screening for cancer for at least 20 years after your treatment.  If Pap tests have been  discontinued, risk factors (such as a new sexual partner)need to be re-assessed to determine if screening should be resumed.  Some women may need screenings more often if they are at high risk for cervical cancer.  Get mammograms done as per the advice of your caregiver. SEEK IMMEDIATE MEDICAL CARE IF:  You develop abnormal vaginal bleeding.  You have pain or swelling in your legs, shortness of breath, or chest pain.  You develop dizziness or headaches.  You have lumps or changes in your breasts or armpits.  You have slurred speech.  You develop weakness or numbness of  your arms or legs.  You have pain, burning, or bleeding when urinating.  You develop abdominal pain.   This information is not intended to replace advice given to you by your health care provider. Make sure you discuss any questions you have with your health care provider.   Document Released: 05/29/2003 Document Revised: 01/14/2015 Document Reviewed: 03/03/2015 Elsevier Interactive Patient Education 2016 [employer] Menopause Menopause is the normal time of life when menstrual periods stop completely. Menopause is complete when you have missed 12 consecutive menstrual periods. It usually occurs between the ages of 48 years and 55 years. Very rarely does a woman develop menopause before the age of 40 years. At menopause, your ovaries stop producing the female hormones estrogen and progesterone. This can cause undesirable symptoms and also affect your health. Sometimes the symptoms may occur 4-5 years before the menopause begins. There is no relationship between menopause and:  Oral contraceptives.  Number of children you had.  Race.  The age your menstrual periods started (menarche). Heavy smokers and very thin women may develop menopause earlier in life. CAUSES  The ovaries stop producing the female hormones estrogen and progesterone.  Other causes include:  Surgery to remove both ovaries.  The  ovaries stop functioning for no known reason.  Tumors of the pituitary gland in the brain.  Medical disease that affects the ovaries and hormone production.  Radiation treatment to the abdomen or pelvis.  Chemotherapy that affects the ovaries. SYMPTOMS   Hot flashes.  Night sweats.  Decrease in sex drive.  Vaginal dryness and thinning of the vagina causing painful intercourse.  Dryness of the skin and developing wrinkles.  Headaches.  Tiredness.  Irritability.  Memory problems.  Weight gain.  Bladder infections.  Hair growth of the face and chest.  Infertility. More serious symptoms include:  Loss of bone (osteoporosis) causing breaks (fractures).  Depression.  Hardening and narrowing of the arteries (atherosclerosis) causing heart attacks and strokes. DIAGNOSIS   When the menstrual periods have stopped for 12 straight months.  Physical exam.  Hormone studies of the blood. TREATMENT  There are many treatment choices and nearly as many questions about them. The decisions to treat or not to treat menopausal changes is an individual choice made with your health care provider. Your health care provider can discuss the treatments with you. Together, you can decide which treatment will work best for you. Your treatment choices may include:   Hormone therapy (estrogen and progesterone).  Non-hormonal medicines.  Treating the individual symptoms with medicine (for example antidepressants for depression).  Herbal medicines that may help specific symptoms.  Counseling by a psychiatrist or psychologist.  Group therapy.  Lifestyle changes including:  Eating healthy.  Regular exercise.  Limiting caffeine and alcohol.  Stress management and meditation.  No treatment. HOME CARE INSTRUCTIONS   Take the medicine your health care provider gives you as directed.  Get plenty of sleep and rest.  Exercise regularly.  Eat a diet that contains calcium (good  for the bones) and soy products (acts like estrogen hormone).  Avoid alcoholic beverages.  Do not smoke.  If you have hot flashes, dress in layers.  Take supplements, calcium, and vitamin D to strengthen bones.  You can use over-the-counter lubricants or moisturizers for vaginal dryness.  Group therapy is sometimes very helpful.  Acupuncture may be helpful in some cases. SEEK MEDICAL CARE IF:   You are not sure you are in menopause.  You are having menopausal symptoms and  need advice and treatment.  You are still having menstrual periods after age 49 years.  You have pain with intercourse.  Menopause is complete (no menstrual period for 12 months) and you develop vaginal bleeding.  You need a referral to a specialist (gynecologist, psychiatrist, or psychologist) for treatment. SEEK IMMEDIATE MEDICAL CARE IF:   You have severe depression.  You have excessive vaginal bleeding.  You fell and think you have a broken bone.  You have pain when you urinate.  You develop leg or chest pain.  You have a fast pounding heart beat (palpitations).  You have severe headaches.  You develop vision problems.  You feel a lump in your breast.  You have abdominal pain or severe indigestion.   This information is not intended to replace advice given to you by your health care provider. Make sure you discuss any questions you have with your health care provider.   Document Released: 11/20/2003 Document Revised: 05/02/2013 Document Reviewed: 03/29/2013 Elsevier Interactive Patient Education [employer]2016 [employer] Inc.

## 2016-07-23 NOTE — Addendum Note (Signed)
Addended by: Berna SpareASTILLO, BLANCA A on: 07/23/2016 09:14 AM   Modules accepted: Orders

## 2016-07-23 NOTE — Progress Notes (Signed)
Barbara Watson 19-Sep-1965 [id number]   History:    50 y.o.  for annual gyn exam who is main concerns is since last year her hot flashes, irritability, mood swing has increased. Last year her FSH was found to be elevated at 89.9 and she was not radiating start any hormonal replacement therapy. She reports having had a menstrual period in December 2015 and then once again in January 2015 and has not had one since then. Patient suffers also from constipation and has responded at times when she was prescribed Linzess. Patient with no previous history of any abnormal Pap smears. Patient requesting flu vaccine today.  Past medical history,surgical history, family history and social history were all reviewed and documented in the EPIC chart.  Gynecologic History Patient's last menstrual period was 03/27/2015. Contraception: post menopausal status Last Pap: 2012 2016. Results were: normal Last mammogram: 2016. Results were: normal  Obstetric History OB History  Gravida Para Term Preterm AB Living  2 2       2   SAB TAB Ectopic Multiple Live Births               # Outcome Date GA Lbr Len/2nd Weight Sex Delivery Anes PTL Lv  2 Para           1 Para                ROS: A ROS was performed and pertinent positives and negatives are included in the history.  GENERAL: No fevers or chills. HEENT: No change in vision, no earache, sore throat or sinus congestion. NECK: No pain or stiffness. CARDIOVASCULAR: No chest pain or pressure. No palpitations. PULMONARY: No shortness of breath, cough or wheeze. GASTROINTESTINAL: No abdominal pain, nausea, vomiting or diarrhea, melena or bright red blood per rectum. GENITOURINARY: No urinary frequency, urgency, hesitancy or dysuria. MUSCULOSKELETAL: No joint or muscle pain, no back pain, no recent trauma. DERMATOLOGIC: No rash, no itching, no lesions. ENDOCRINE: No polyuria, polydipsia, no heat or cold intolerance. No recent change in weight. HEMATOLOGICAL: No  anemia or easy bruising or bleeding. NEUROLOGIC: No headache, seizures, numbness, tingling or weakness. PSYCHIATRIC: No depression, no loss of interest in normal activity or change in sleep pattern.     Exam: chaperone present  BP 114/72   Ht 5' 4.5" (1.638 m)   Wt 153 lb (69.4 kg)   LMP 03/27/2015   BMI 25.86 kg/m   Body mass index is 25.86 kg/m.  General appearance : Well developed well nourished female. No acute distress HEENT: Eyes: no retinal hemorrhage or exudates,  Neck supple, trachea midline, no carotid bruits, no thyroidmegaly Lungs: Clear to auscultation, no rhonchi or wheezes, or rib retractions  Heart: Regular rate and rhythm, no murmurs or gallops Breast:Examined in sitting and supine position were symmetrical in appearance, no palpable masses or tenderness,  no skin retraction, no nipple inversion, no nipple discharge, no skin discoloration, no axillary or supraclavicular lymphadenopathy Abdomen: no palpable masses or tenderness, no rebound or guarding Extremities: no edema or skin discoloration or tenderness  Pelvic:  Bartholin, Urethra, Skene Glands: Within normal limits             Vagina: No gross lesions or discharge  Cervix: No gross lesions or discharge  Uterus  anteverted, normal size, shape and consistency, non-tender and mobile  Adnexa  Without masses or tenderness  Anus and perineum  normal   Rectovaginal  normal sphincter tone without palpated masses or tenderness  Hemoccult not indicated     Assessment/Plan: 50 year old with signs and symptoms consistent with menopause. Last year her FSH was found to be in the menopausal range with evaluating 9.9. We spent an additional 15 minutes discussing hormone replacement therapy as well as the women's health initiative study and most recent guidelines on prescribing hormone replacement therapy to include the potential risk of DVT, pulmonary embolism and breast cancer. We also discussed different routes  of administration from oral, transdermal patches and gels. She is going to be started on Estrace 0.5 mg by mouth daily with the addition of Prometrium 200 mg for the first 12 days of the month. Patient is fasting today and the following screening blood work will be drawn today: Comprehensive metabolic panel, fasting lipid profile, TSH, CBC, and urinalysis. Pap smear not indicated this year. Patient scheduled for mammogram later this month whereby she will have a three-dimensional mammogram because of her dense breasts. She did receive the flu vaccine today after she was counseled. We discussed importance of calcium vitamin D and weightbearing exercises for osteoporosis prevention. For her constipation prescription refill for Barbara Watson    Barbara Madry H MD, 8:56 AM 07/23/2016

## 2016-07-24 LAB — URINALYSIS W MICROSCOPIC + REFLEX CULTURE
BILIRUBIN URINE: NEGATIVE
CASTS: NONE SEEN [LPF]
CRYSTALS: NONE SEEN [HPF]
Glucose, UA: NEGATIVE
Hgb urine dipstick: NEGATIVE
Ketones, ur: NEGATIVE
Leukocytes, UA: NEGATIVE
Nitrite: NEGATIVE
Protein, ur: NEGATIVE
SPECIFIC GRAVITY, URINE: 1.026 (ref 1.001–1.035)
Yeast: NONE SEEN [HPF]
pH: 5.5 (ref 5.0–8.0)

## 2016-07-25 LAB — URINE CULTURE: ORGANISM ID, BACTERIA: NO GROWTH

## 2016-07-30 ENCOUNTER — Ambulatory Visit
Admission: RE | Admit: 2016-07-30 | Discharge: 2016-07-30 | Disposition: A | Discharge status: Home / Self Care | Payer: BLUE CROSS/BLUE SHIELD | Source: Ambulatory Visit | Attending: Gynecology | Admitting: Gynecology

## 2016-07-30 DIAGNOSIS — Z1231 Encounter for screening mammogram for malignant neoplasm of breast: Secondary | ICD-10-CM

## 2017-01-26 ENCOUNTER — Encounter: Payer: Self-pay | Admitting: Gynecology

## 2017-07-18 ENCOUNTER — Other Ambulatory Visit: Payer: Self-pay | Admitting: Obstetrics & Gynecology

## 2017-07-18 DIAGNOSIS — Z1231 Encounter for screening mammogram for malignant neoplasm of breast: Secondary | ICD-10-CM

## 2017-07-26 ENCOUNTER — Other Ambulatory Visit: Payer: Self-pay

## 2017-07-27 ENCOUNTER — Other Ambulatory Visit: Payer: Self-pay

## 2017-07-27 MED ORDER — ESTRADIOL 0.5 MG PO TABS: 0.5000 mg | ORAL_TABLET | Freq: Every day | ORAL | 0 refills | Status: AC

## 2017-08-15 ENCOUNTER — Ambulatory Visit
Admission: RE | Admit: 2017-08-15 | Discharge: 2017-08-15 | Disposition: A | Discharge status: Home / Self Care | Payer: BLUE CROSS/BLUE SHIELD | Source: Ambulatory Visit | Attending: Obstetrics & Gynecology | Admitting: Obstetrics & Gynecology

## 2017-08-15 DIAGNOSIS — Z1231 Encounter for screening mammogram for malignant neoplasm of breast: Secondary | ICD-10-CM

## 2018-08-16 ENCOUNTER — Other Ambulatory Visit: Payer: Self-pay | Admitting: Obstetrics & Gynecology

## 2018-08-16 DIAGNOSIS — Z1231 Encounter for screening mammogram for malignant neoplasm of breast: Secondary | ICD-10-CM

## 2018-10-05 ENCOUNTER — Ambulatory Visit: Payer: BLUE CROSS/BLUE SHIELD

## 2018-10-27 ENCOUNTER — Ambulatory Visit: Payer: BLUE CROSS/BLUE SHIELD

## 2022-01-24 ENCOUNTER — Emergency Department (HOSPITAL_BASED_OUTPATIENT_CLINIC_OR_DEPARTMENT_OTHER): Payer: No Typology Code available for payment source

## 2022-01-24 ENCOUNTER — Emergency Department (HOSPITAL_BASED_OUTPATIENT_CLINIC_OR_DEPARTMENT_OTHER)
Admission: EM | Admit: 2022-01-24 | Discharge: 2022-01-24 | Disposition: A | Discharge status: Home / Self Care | Payer: No Typology Code available for payment source | Attending: Emergency Medicine | Admitting: Emergency Medicine

## 2022-01-24 ENCOUNTER — Other Ambulatory Visit: Payer: Self-pay

## 2022-01-24 ENCOUNTER — Encounter (HOSPITAL_BASED_OUTPATIENT_CLINIC_OR_DEPARTMENT_OTHER): Payer: Self-pay | Admitting: Emergency Medicine

## 2022-01-24 DIAGNOSIS — S0081XA Abrasion of other part of head, initial encounter: Secondary | ICD-10-CM | POA: Diagnosis not present

## 2022-01-24 DIAGNOSIS — S62306A Unspecified fracture of fifth metacarpal bone, right hand, initial encounter for closed fracture: Secondary | ICD-10-CM | POA: Insufficient documentation

## 2022-01-24 DIAGNOSIS — S0990XA Unspecified injury of head, initial encounter: Secondary | ICD-10-CM | POA: Diagnosis not present

## 2022-01-24 DIAGNOSIS — S80212A Abrasion, left knee, initial encounter: Secondary | ICD-10-CM | POA: Insufficient documentation

## 2022-01-24 DIAGNOSIS — W01198A Fall on same level from slipping, tripping and stumbling with subsequent striking against other object, initial encounter: Secondary | ICD-10-CM | POA: Insufficient documentation

## 2022-01-24 DIAGNOSIS — S6991XA Unspecified injury of right wrist, hand and finger(s), initial encounter: Secondary | ICD-10-CM | POA: Diagnosis present

## 2022-01-24 DIAGNOSIS — M25531 Pain in right wrist: Secondary | ICD-10-CM | POA: Insufficient documentation

## 2022-01-24 MED ORDER — OXYCODONE HCL 5 MG PO TABS: 5.0000 mg | ORAL_TABLET | Freq: Four times a day (QID) | ORAL | 0 refills | Status: AC | PRN

## 2022-01-24 MED ORDER — ONDANSETRON 4 MG PO TBDP
4.0000 mg | ORAL_TABLET | Freq: Once | ORAL | Status: AC
  Administered 2022-01-24: 4 mg via ORAL
  Filled 2022-01-24: qty 1

## 2022-01-24 MED ORDER — OXYCODONE-ACETAMINOPHEN 5-325 MG PO TABS
1.0000 | ORAL_TABLET | Freq: Once | ORAL | Status: AC
  Administered 2022-01-24: 1 via ORAL
  Filled 2022-01-24: qty 1

## 2022-01-24 NOTE — ED Provider Notes (Signed)
?[facility] EMERGENCY DEPARTMENT ?Provider Note ? ? ?CSN: [id number] ?Arrival date & time: 01/24/22  1442 ? ?  ? ?History ? ?Chief Complaint  ?Patient presents with  ? Fall  ? ? ?Barbara Watson is a 56 y.o. female.  Presents to ER due to concern for fall.  Patient states that she tripped while holding her daughter's dog.  Struck her right face, right hand and wrist.  Also had abrasion to her left knee.  Has been able to walk without any difficulty.  Did feel lightheaded after having the head trauma.  Did not fully pass out.  Has had some nausea.  No vomiting.  States that she felt completely fine prior to the fall.  She denies any major medical problems, not on blood thinners. ? ?Pt is RHD.  ? ?Accompanied by husband.  ? ?HPI ? ?  ? ?Home Medications ?Prior to Admission medications   ?Medication Sig Start Date End Date Taking? Authorizing Provider  ?CALCIUM PO Take by mouth daily.      [provider]  ?Cranberry Extract 250 MG TABS Take by mouth.    [provider]  ?estradiol (ESTRACE) 0.5 MG tablet Take 1 tablet (0.5 mg total) daily by mouth. 07/27/17   Genia Del, MD  ?hydrocortisone (ANUSOL-HC) 25 MG suppository Place 1 suppository (25 mg total) rectally 2 (two) times daily. ?Patient not taking: Reported on 07/23/2016 05/15/15   Ok Edwards, MD  ?linaclotide Karlene Einstein) 145 MCG CAPS capsule Take 1 capsule (145 mcg total) by mouth daily. 07/23/16   Ok Edwards, MD  ?Multiple Vitamin (MULTIVITAMIN PO) Take by mouth daily.      [provider]  ?progesterone (PROMETRIUM) 200 MG capsule Take 1 capsule (200 mg total) by mouth daily. Take one tablet the first 12 days of the month 07/23/16   Ok Edwards, MD  ?   ? ?Allergies    ?Patient has no known allergies.   ? ?Review of Systems   ?Review of Systems  ?Musculoskeletal:  Positive for arthralgias.  ?All other systems reviewed and are negative. ? ?Physical Exam ?Updated Vital Signs ?BP (!) 147/94 (BP  Location: Left Arm)   Pulse 85   Temp 98.2 ?F (36.8 ?C) (Oral)   Resp 18   Ht 5\' 4"  (1.626 m)   Wt 72.6 kg   LMP 03/27/2015   SpO2 97%   BMI 27.46 kg/m?  ?Physical Exam ?Vitals and nursing note reviewed.  ?Constitutional:   ?   General: She is not in acute distress. ?   Appearance: She is well-developed.  ?HENT:  ?   Head: Normocephalic.  ?   Comments: Mild abrasion to the right face, no palpalbe skull of facial deformitry ?Eyes:  ?   Conjunctiva/sclera: Conjunctivae normal.  ?Cardiovascular:  ?   Rate and Rhythm: Normal rate and regular rhythm.  ?   Pulses: Normal pulses.  ?   Heart sounds: No murmur heard. ?Pulmonary:  ?   Effort: Pulmonary effort is normal. No respiratory distress.  ?   Breath sounds: Normal breath sounds.  ?Abdominal:  ?   Palpations: Abdomen is soft.  ?   Tenderness: There is no abdominal tenderness.  ?Musculoskeletal:     ?   General: No swelling.  ?   Cervical back: Neck supple.  ?   Comments: Right upper extremity: There is some tenderness to palpation over the wrist and hand, most prominently at the base of the fifth metacarpal bone ?Left lower  extremity: There is abrasion to the anterior left knee but there is no bony tenderness about the entirety of the extremity  ?Skin: ?   General: Skin is warm and dry.  ?   Capillary Refill: Capillary refill takes less than 2 seconds.  ?Neurological:  ?   General: No focal deficit present.  ?   Mental Status: She is alert.  ?Psychiatric:     ?   Mood and Affect: Mood normal.  ? ? ?ED Results / Procedures / Treatments   ?Labs ?(all labs ordered are listed, but only abnormal results are displayed) ?Labs Reviewed - No data to display ? ?EKG ?None ? ?Radiology ?DG Wrist Complete Right ? ?Result Date: 01/24/2022 ?CLINICAL DATA:  Fall, pain EXAM: RIGHT WRIST - COMPLETE 3+ VIEW COMPARISON:  None Available. FINDINGS: Carpal bones are intact and aligned. Acute minimally displaced fracture through the base of the fifth metacarpal. IMPRESSION: Acute  minimally displaced fracture through the base of the fifth metacarpal. Electronically Signed   By: Jannifer Hick M.D.   On: 01/24/2022 15:39  ? ?CT Head Wo Contrast ? ?Result Date: 01/24/2022 ?CLINICAL DATA:  Head trauma EXAM: CT HEAD WITHOUT CONTRAST TECHNIQUE: Contiguous axial images were obtained from the base of the skull through the vertex without intravenous contrast. RADIATION DOSE REDUCTION: This exam was performed according to the departmental dose-optimization program which includes automated exposure control, adjustment of the mA and/or kV according to patient size and/or use of iterative reconstruction technique. COMPARISON:  None Available. FINDINGS: Brain: No acute intracranial hemorrhage, mass effect, or herniation. No extra-axial fluid collections. No evidence of acute territorial infarct. No hydrocephalus. Vascular: No hyperdense vessel or unexpected calcification. Skull: Normal. Negative for fracture or focal lesion. Sinuses/Orbits: No acute finding. Other: None. IMPRESSION: No acute intracranial process identified. Electronically Signed   By: Jannifer Hick M.D.   On: 01/24/2022 15:40  ? ?DG Hand Complete Right ? ?Result Date: 01/24/2022 ?CLINICAL DATA:  Fall, pain EXAM: RIGHT HAND - COMPLETE 3+ VIEW COMPARISON:  None Available. FINDINGS: Acute minimally displaced fracture through the base of the fifth metacarpal. Associated soft tissue swelling. No additional fracture or dislocation identified. Mild arthritic changes in the fingers. IMPRESSION: Acute minimally displaced fracture through the base of the fifth metacarpal. Electronically Signed   By: Jannifer Hick M.D.   On: 01/24/2022 15:38   ? ?Procedures ?Procedures  ? ? ?Medications Ordered in ED ?Medications  ?ondansetron (ZOFRAN-ODT) disintegrating tablet 4 mg (4 mg Oral Given 01/24/22 1546)  ?oxyCODONE-acetaminophen (PERCOCET/ROXICET) 5-325 MG per tablet 1 tablet (1 tablet Oral Given 01/24/22 1546)  ? ? ?ED Course/ Medical Decision  Making/ A&P ?  ?                        ?Medical Decision Making ?Amount and/or Complexity of Data Reviewed ?Radiology: ordered. ? ?Risk ?Prescription drug management. ? ? ?56 year old lady presented to ER after a chemical fall.  On exam noted tenderness to the right hand, abrasion to the right face, left knee abrasion.  Due to feeling lightheaded and nauseated post head injury, check CT of head.  No acute traumatic findings identified.  X-ray of wrist was concerning for minimally displaced fracture at base of fifth metacarpal bone.  Will place an ulnar gutter splint, will recommend follow-up with hand surgery, nonweightbearing for now.  Reviewed return precautions and discharged home. ? ? ? ? ? ? ? ?Final Clinical Impression(s) / ED Diagnoses ?Final diagnoses:  ?Unspecified fracture  of fifth metacarpal bone, right hand, initial encounter for closed fracture  ?Abrasion of face, initial encounter  ?Abrasion of left knee, initial encounter  ? ? ?Rx / DC Orders ?ED Discharge Orders   ? ? None  ? ?  ? ? ?  ?Milagros Lollykstra, Delano Frate S, MD ?01/24/22 1554 ? ?

## 2022-01-24 NOTE — Discharge Instructions (Addendum)
Please follow-up with hand specialist, Dr. Victorino Dike at [facility].  Call their office on Monday to get a follow-up appointment. ? ?Continue the splint, recommend no weightbearing for now until you receive further instructions by orthopedics. ? ?Take Tylenol and Motrin as needed for pain control.  For breakthrough pain take the prescribed oxycodone as needed.  Note this can make you drowsy and should not be taken while driving. ?

## 2022-01-24 NOTE — ED Notes (Signed)
Patient refused the sling ?

## 2022-01-24 NOTE — ED Notes (Signed)
Pt reports feeling dizzy, placed on triage chair and reclined ?

## 2022-01-24 NOTE — ED Triage Notes (Signed)
Pt arrives pov to triage in wheelchair, c/o mechanical fall pta. Pt endorses right hand pain, HA, abrasion to right cheek, and abrasion to left knee. Pt denies loc, denies thinners. Reports nausea, lethargy ?

## 2024-04-25 IMAGING — DX DG WRIST COMPLETE 3+V*R*
4 series · 4 of 4 positions shown · non-contrast
Comparison: None Available.

CLINICAL DATA: Fall, pain

EXAM:
RIGHT WRIST - COMPLETE 3+ VIEW

[wrist pa]
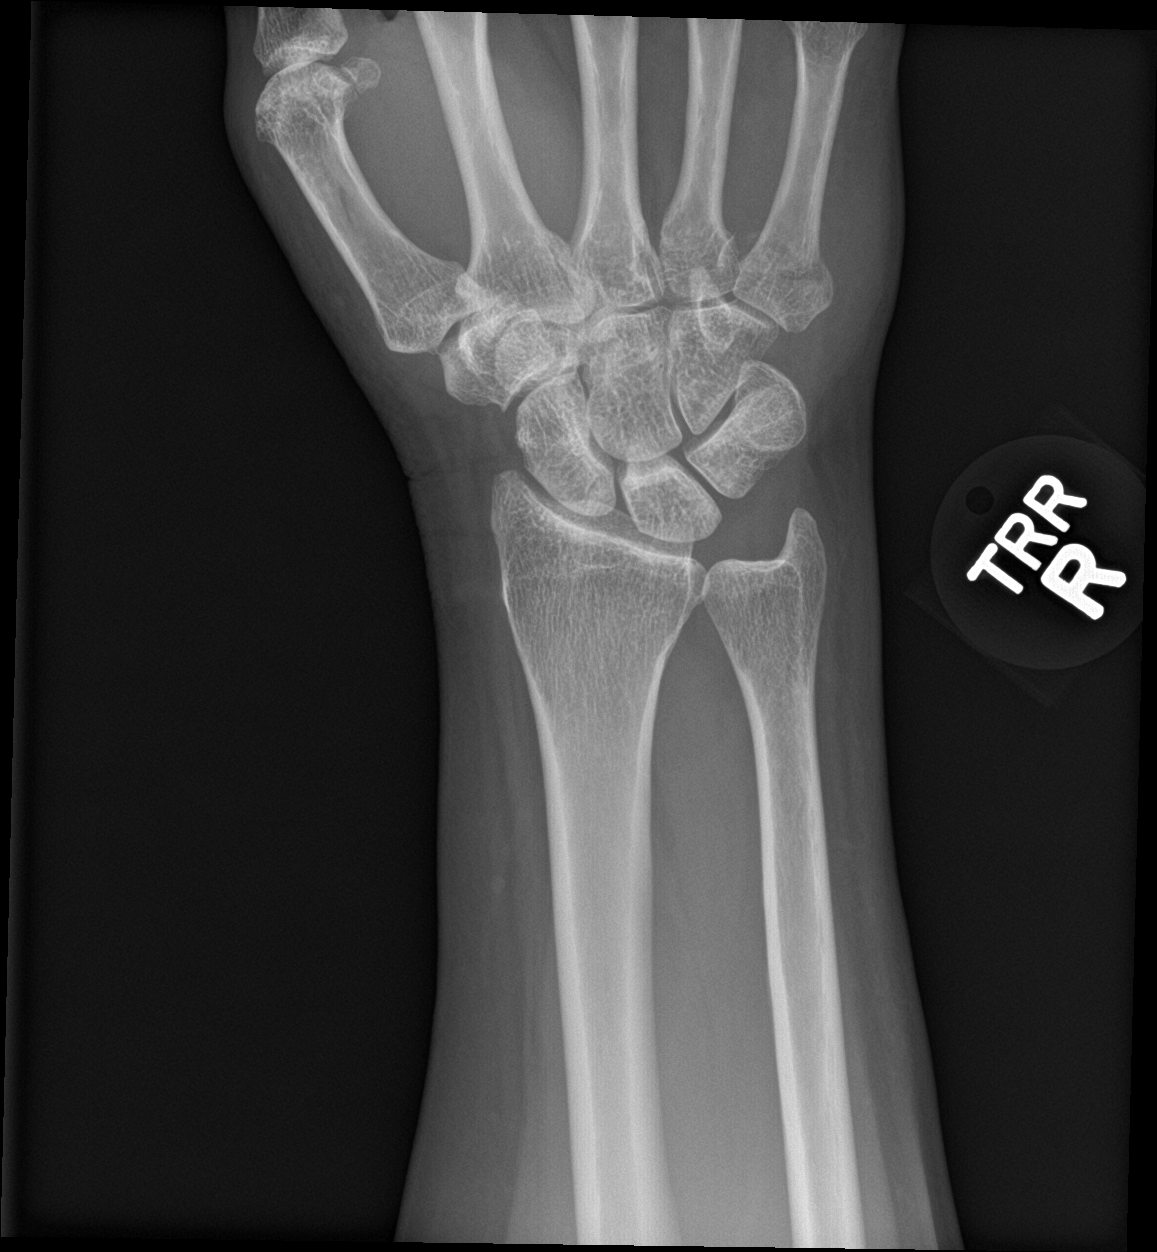

[wrist obl]
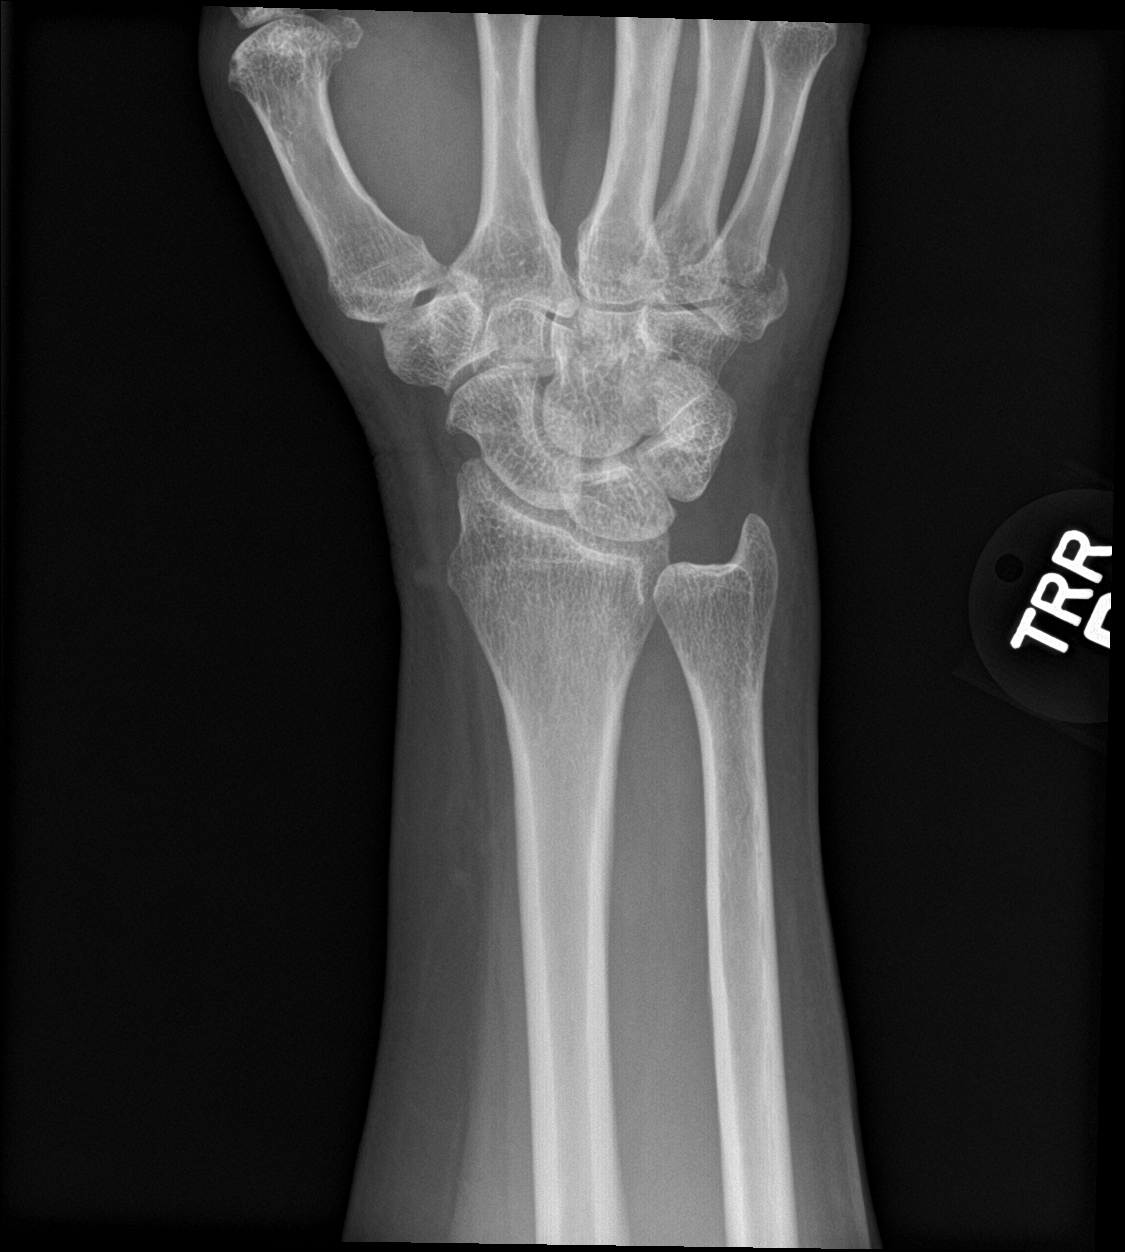

[wrist lat]
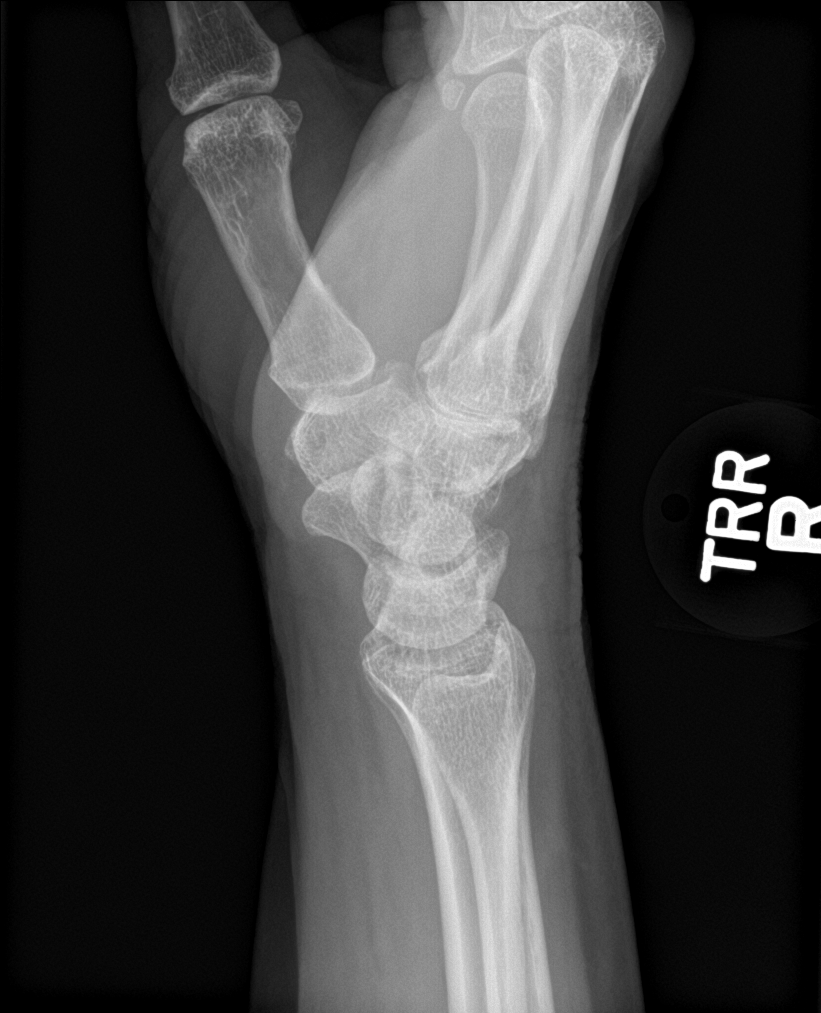

[wrist navicular]
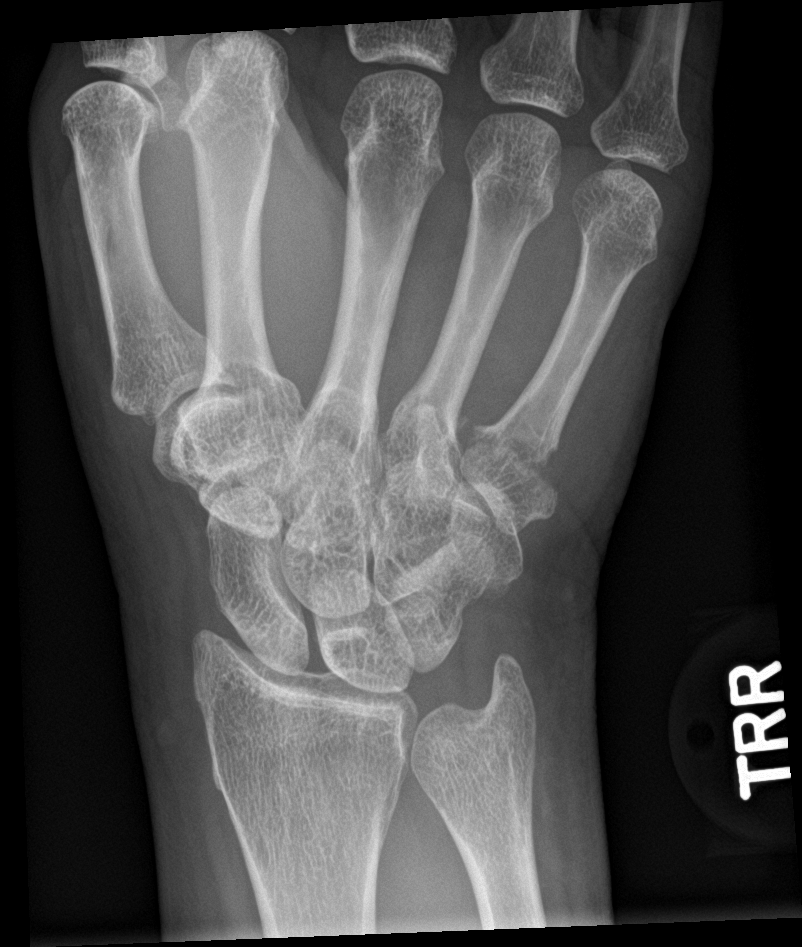

[4 of 4 positions shown; findings below may reference images not displayed]

FINDINGS: Carpal bones are intact and aligned. Acute minimally displaced
fracture through the base of the fifth metacarpal.
IMPRESSION: Acute minimally displaced fracture through the base of the fifth
metacarpal.
